# Patient Record
Sex: Female | Born: 1990 | Hispanic: Yes | Marital: Married | Smoking: Current every day smoker
Health system: Southern US, Community
[De-identification: ages and names within clinical notes are randomized; demographics above are authoritative.]

## PROBLEM LIST (undated history)

## (undated) ENCOUNTER — Inpatient Hospital Stay (HOSPITAL_COMMUNITY): Payer: Self-pay

## (undated) DIAGNOSIS — M1990 Unspecified osteoarthritis, unspecified site: Secondary | ICD-10-CM

## (undated) DIAGNOSIS — R0600 Dyspnea, unspecified: Secondary | ICD-10-CM

## (undated) DIAGNOSIS — T7840XA Allergy, unspecified, initial encounter: Secondary | ICD-10-CM

## (undated) DIAGNOSIS — E119 Type 2 diabetes mellitus without complications: Secondary | ICD-10-CM

## (undated) DIAGNOSIS — F419 Anxiety disorder, unspecified: Secondary | ICD-10-CM

## (undated) DIAGNOSIS — J45909 Unspecified asthma, uncomplicated: Secondary | ICD-10-CM

## (undated) DIAGNOSIS — R569 Unspecified convulsions: Secondary | ICD-10-CM

## (undated) DIAGNOSIS — I10 Essential (primary) hypertension: Secondary | ICD-10-CM

## (undated) HISTORY — DX: Anxiety disorder, unspecified: F41.9

## (undated) HISTORY — PX: TONSILLECTOMY: SUR1361

## (undated) HISTORY — PX: WISDOM TOOTH EXTRACTION: SHX21

## (undated) HISTORY — DX: Allergy, unspecified, initial encounter: T78.40XA

## (undated) HISTORY — DX: Unspecified osteoarthritis, unspecified site: M19.90

---
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
## 2012-04-29 ENCOUNTER — Emergency Department: Payer: Self-pay | Admitting: Emergency Medicine

## 2013-08-31 ENCOUNTER — Emergency Department: Payer: Self-pay | Admitting: Emergency Medicine

## 2013-08-31 LAB — COMPREHENSIVE METABOLIC PANEL
ALK PHOS: 70 U/L
ALT: 23 U/L (ref 12–78)
ANION GAP: 4 — AB (ref 7–16)
AST: 16 U/L (ref 15–37)
Albumin: 3.4 g/dL (ref 3.4–5.0)
BILIRUBIN TOTAL: 0.2 mg/dL (ref 0.2–1.0)
BUN: 11 mg/dL (ref 7–18)
Calcium, Total: 8.7 mg/dL (ref 8.5–10.1)
Chloride: 106 mmol/L (ref 98–107)
Co2: 28 mmol/L (ref 21–32)
Creatinine: 0.6 mg/dL (ref 0.60–1.30)
GLUCOSE: 88 mg/dL (ref 65–99)
OSMOLALITY: 274 (ref 275–301)
Potassium: 4.1 mmol/L (ref 3.5–5.1)
Sodium: 138 mmol/L (ref 136–145)
Total Protein: 8.1 g/dL (ref 6.4–8.2)

## 2013-08-31 LAB — CBC WITH DIFFERENTIAL/PLATELET
BASOS ABS: 0.1 10*3/uL (ref 0.0–0.1)
Basophil %: 0.5 %
EOS PCT: 1.6 %
Eosinophil #: 0.2 10*3/uL (ref 0.0–0.7)
HCT: 43.9 % (ref 35.0–47.0)
HGB: 14.7 g/dL (ref 12.0–16.0)
LYMPHS ABS: 4.1 10*3/uL — AB (ref 1.0–3.6)
Lymphocyte %: 28.9 %
MCH: 30 pg (ref 26.0–34.0)
MCHC: 33.5 g/dL (ref 32.0–36.0)
MCV: 89 fL (ref 80–100)
Monocyte #: 0.9 x10 3/mm (ref 0.2–0.9)
Monocyte %: 6.3 %
NEUTROS PCT: 62.7 %
Neutrophil #: 8.9 10*3/uL — ABNORMAL HIGH (ref 1.4–6.5)
PLATELETS: 202 10*3/uL (ref 150–440)
RBC: 4.91 10*6/uL (ref 3.80–5.20)
RDW: 14 % (ref 11.5–14.5)
WBC: 14.2 10*3/uL — AB (ref 3.6–11.0)

## 2013-08-31 LAB — URINALYSIS, COMPLETE
Bacteria: NONE SEEN
Bilirubin,UR: NEGATIVE
Glucose,UR: NEGATIVE mg/dL (ref 0–75)
KETONE: NEGATIVE
LEUKOCYTE ESTERASE: NEGATIVE
NITRITE: NEGATIVE
PROTEIN: NEGATIVE
Ph: 6 (ref 4.5–8.0)
RBC,UR: 7 /HPF (ref 0–5)
Specific Gravity: 1.023 (ref 1.003–1.030)
WBC UR: 2 /HPF (ref 0–5)

## 2013-08-31 LAB — PREGNANCY, URINE: Pregnancy Test, Urine: NEGATIVE m[IU]/mL

## 2013-08-31 LAB — LIPASE, BLOOD: Lipase: 101 U/L (ref 73–393)

## 2015-01-27 ENCOUNTER — Encounter: Payer: Self-pay | Admitting: *Deleted

## 2015-01-27 ENCOUNTER — Emergency Department
Admission: EM | Admit: 2015-01-27 | Discharge: 2015-01-27 | Disposition: A | Discharge status: Home / Self Care | Payer: Self-pay | Attending: Emergency Medicine | Admitting: Emergency Medicine

## 2015-01-27 DIAGNOSIS — N3001 Acute cystitis with hematuria: Secondary | ICD-10-CM

## 2015-01-27 DIAGNOSIS — Z720 Tobacco use: Secondary | ICD-10-CM | POA: Insufficient documentation

## 2015-01-27 DIAGNOSIS — Z3202 Encounter for pregnancy test, result negative: Secondary | ICD-10-CM | POA: Insufficient documentation

## 2015-01-27 DIAGNOSIS — E119 Type 2 diabetes mellitus without complications: Secondary | ICD-10-CM | POA: Insufficient documentation

## 2015-01-27 HISTORY — DX: Type 2 diabetes mellitus without complications: E11.9

## 2015-01-27 LAB — URINALYSIS COMPLETE WITH MICROSCOPIC (ARMC ONLY)
BACTERIA UA: NONE SEEN
Bilirubin Urine: NEGATIVE
Glucose, UA: NEGATIVE mg/dL
Ketones, ur: NEGATIVE mg/dL
Nitrite: NEGATIVE
PH: 7 (ref 5.0–8.0)
Protein, ur: 30 mg/dL — AB
Specific Gravity, Urine: 1.025 (ref 1.005–1.030)

## 2015-01-27 MED ORDER — SULFAMETHOXAZOLE-TRIMETHOPRIM 800-160 MG PO TABS: 1.0000 | ORAL_TABLET | Freq: Two times a day (BID) | ORAL | Status: DC

## 2015-01-27 MED ORDER — PHENAZOPYRIDINE HCL 200 MG PO TABS: 200.0000 mg | ORAL_TABLET | Freq: Three times a day (TID) | ORAL | Status: DC | PRN

## 2015-01-27 NOTE — ED Notes (Signed)
Urine Preg - Negative 

## 2015-01-27 NOTE — ED Notes (Signed)
Discussed discharge instructions, prescriptions, and follow-up care with patient. No questions or concerns at this time. Pt stable at discharge.  

## 2015-01-27 NOTE — Discharge Instructions (Signed)

## 2015-01-27 NOTE — ED Provider Notes (Signed)
[facility] Emergency Department Provider Note  ____________________________________________  Time seen: Approximately 4:12 PM  I have reviewed the triage vital signs and the nursing notes.   HISTORY  Chief Complaint Urinary Frequency    HPI Carla Nichols is a 24 y.o. female who presents for evaluation of urinary tract burning and frequency for 4 days.   Past Medical History  Diagnosis Date  . Diabetes mellitus without complication (HCC)     There are no active problems to display for this patient.   History reviewed. No pertinent past surgical history.  Current Outpatient Rx  Name  Route  Sig  Dispense  Refill  . phenazopyridine (PYRIDIUM) 200 MG tablet   Oral   Take 1 tablet (200 mg total) by mouth 3 (three) times daily as needed for pain.   6 tablet   0   . sulfamethoxazole-trimethoprim (BACTRIM DS,SEPTRA DS) 800-160 MG tablet   Oral   Take 1 tablet by mouth 2 (two) times daily.   14 tablet   0     Allergies Review of patient's allergies indicates no known allergies.  History reviewed. No pertinent family history.  Social History Social History  Substance Use Topics  . Smoking status: Current Every Day Smoker  . Smokeless tobacco: None  . Alcohol Use: None    Review of Systems Constitutional: No fever/chills Eyes: No visual changes. ENT: No sore throat. Cardiovascular: Denies chest pain. Respiratory: Denies shortness of breath. Gastrointestinal: No abdominal pain.  No nausea, no vomiting.  No diarrhea.  No constipation. Genitourinary: Positive for dysuria. Musculoskeletal: Positive for back pain. Skin: Negative for rash. Neurological: Negative for headaches, focal weakness or numbness.  10-point ROS otherwise negative.  ____________________________________________   PHYSICAL EXAM:  VITAL SIGNS: ED Triage Vitals  Enc Vitals Group     BP 01/27/15 1521 135/80 mmHg     Pulse Rate 01/27/15 1521 70     Resp  01/27/15 1521 20     Temp 01/27/15 1521 97.6 F (36.4 C)     Temp Source 01/27/15 1521 Oral     SpO2 01/27/15 1521 99 %     Weight 01/27/15 1521 220 lb (99.791 kg)     Height 01/27/15 1521  (1.6 m)     Head Cir --      Peak Flow --      Pain Score 01/27/15 1522 8     Pain Loc --      Pain Edu? --      Excl. in GC? --     Constitutional: Alert and oriented. Well appearing and in no acute distress. Cardiovascular: Normal rate, regular rhythm. Grossly normal heart sounds.  Good peripheral circulation. Respiratory: Normal respiratory effort.  No retractions. Lungs CTAB. Gastrointestinal: Soft and nontender. No distention. No abdominal bruits. Mild CVA tenderness. Musculoskeletal: No lower extremity tenderness nor edema.  No joint effusions. Neurologic:  Normal speech and language. No gross focal neurologic deficits are appreciated. No gait instability. Skin:  Skin is warm, dry and intact. No rash noted. Psychiatric: Mood and affect are normal. Speech and behavior are normal.  ____________________________________________   LABS (all labs ordered are listed, but only abnormal results are displayed)  Labs Reviewed  URINALYSIS COMPLETEWITH MICROSCOPIC ([facility] ONLY) - Abnormal; Notable for the following:    Color, Urine YELLOW (*)    APPearance CLOUDY (*)    Hgb urine dipstick 1+ (*)    Protein, ur 30 (*)    Leukocytes, UA 3+ (*)  Squamous Epithelial / LPF 0-5 (*)    All other components within normal limits  POC URINE PREG, ED      PROCEDURES  Procedure(s) performed: None  Critical Care performed: No  ____________________________________________   INITIAL IMPRESSION / ASSESSMENT AND PLAN / ED COURSE  Pertinent labs & imaging results that were available during my care of the patient were reviewed by me and considered in my medical decision making (see chart for details).  Acute urinary tract infection. Rx given for Bactrim DS twice a day #14 and peridium 200 mg 3  times a day #6 patient follow-up with PCP or return to the ER with any worsening symptomology. She voices no other emergency medical complaints at this time. ____________________________________________   FINAL CLINICAL IMPRESSION(S) / ED DIAGNOSES  Final diagnoses:  Acute cystitis with hematuria      Evangeline Dakin, PA-C 01/27/15 1643  Emily Filbert, MD 01/27/15 [phone]

## 2015-01-27 NOTE — ED Notes (Signed)
Pt states urinary burning and frequency for 4 days

## 2015-09-19 ENCOUNTER — Encounter: Payer: Self-pay | Admitting: Urgent Care

## 2015-09-19 ENCOUNTER — Emergency Department
Admission: EM | Admit: 2015-09-19 | Discharge: 2015-09-20 | Disposition: A | Discharge status: Home / Self Care | Payer: Self-pay | Attending: Emergency Medicine | Admitting: Emergency Medicine

## 2015-09-19 DIAGNOSIS — R102 Pelvic and perineal pain: Secondary | ICD-10-CM | POA: Insufficient documentation

## 2015-09-19 DIAGNOSIS — R1011 Right upper quadrant pain: Secondary | ICD-10-CM

## 2015-09-19 DIAGNOSIS — J45909 Unspecified asthma, uncomplicated: Secondary | ICD-10-CM | POA: Insufficient documentation

## 2015-09-19 DIAGNOSIS — I10 Essential (primary) hypertension: Secondary | ICD-10-CM | POA: Insufficient documentation

## 2015-09-19 DIAGNOSIS — E119 Type 2 diabetes mellitus without complications: Secondary | ICD-10-CM | POA: Insufficient documentation

## 2015-09-19 DIAGNOSIS — F17200 Nicotine dependence, unspecified, uncomplicated: Secondary | ICD-10-CM | POA: Insufficient documentation

## 2015-09-19 HISTORY — DX: Essential (primary) hypertension: I10

## 2015-09-19 LAB — POCT PREGNANCY, URINE: PREG TEST UR: NEGATIVE

## 2015-09-19 NOTE — ED Notes (Signed)
Patient presents with c/o suprapubic pain x 4-5 days. (+) dysuria reported. Additionally, patient reports that she is having "electrical impulses" running through her head and her vision went black tonight while sitting on the cough. She reports that is spotting as well; not heavy.

## 2015-09-19 NOTE — ED Notes (Signed)
Date of LMP  Was May 2nd, 2017. Pt reports unprotected sex with possibility of being pregnant, pt reports (-) pregnancy tests at home x2.

## 2015-09-19 NOTE — ED Provider Notes (Signed)
[facility] Emergency Department Provider Note  ____________________________________________  Time seen: 11:40 PM  I have reviewed the triage vital signs and the nursing notes.   HISTORY  Chief Complaint Abdominal Pain and Headache      HPI Carla Nichols is a 25 y.o. female presents with 4 day history of suprapubic discomfort accompanied by nausea and vaginal spotting. Patient states last this appearance May 2 area patient admits to unprotected sex with possibility of being pregnant.     Past Medical History  Diagnosis Date  . Diabetes mellitus without complication (HCC)   . Hypertension     There are no active problems to display for this patient.   Past Surgical History  Procedure Laterality Date  . Tonsillectomy      Current Outpatient Rx  Name  Route  Sig  Dispense  Refill  . phenazopyridine (PYRIDIUM) 200 MG tablet   Oral   Take 1 tablet (200 mg total) by mouth 3 (three) times daily as needed for pain.   6 tablet   0   . sulfamethoxazole-trimethoprim (BACTRIM DS,SEPTRA DS) 800-160 MG tablet   Oral   Take 1 tablet by mouth 2 (two) times daily.   14 tablet   0     Allergies No known drug allergies No family history on file.  Social History Social History  Substance Use Topics  . Smoking status: Current Every Day Smoker  . Smokeless tobacco: None  . Alcohol Use: Yes    Review of Systems  Constitutional: Negative for fever. Eyes: Negative for visual changes. ENT: Negative for sore throat. Cardiovascular: Negative for chest pain. Respiratory: Negative for shortness of breath. Gastrointestinal: Negative for abdominal pain, vomiting and diarrhea. Genitourinary: Negative for dysuria. Musculoskeletal: Negative for back pain. Skin: Negative for rash. Neurological: Negative for headaches, focal weakness or numbness.   10-point ROS otherwise negative.  ____________________________________________   PHYSICAL  EXAM:  VITAL SIGNS: ED Triage Vitals  Enc Vitals Group     BP 09/19/15 2252 141/97 mmHg     Pulse Rate 09/19/15 2252 98     Resp 09/19/15 2252 18     Temp 09/19/15 2252 98.8 F (37.1 C)     Temp Source 09/19/15 2252 Oral     SpO2 09/19/15 2252 98 %     Weight 09/19/15 2252 254 lb (115.214 kg)     Height 09/19/15 2252 5\' 3"  (1.6 m)     Head Cir --      Peak Flow --      Pain Score 09/19/15 2252 8     Pain Loc --      Pain Edu? --      Excl. in GC? --      Constitutional: Alert and oriented. Well appearing and in no distress. Eyes: Conjunctivae are normal. PERRL. Normal extraocular movements. ENT   Head: Normocephalic and atraumatic.   Nose: No congestion/rhinnorhea.   Mouth/Throat: Mucous membranes are moist.   Neck: No stridor. Hematological/Lymphatic/Immunilogical: No cervical lymphadenopathy. Cardiovascular: Normal rate, regular rhythm. Normal and symmetric distal pulses are present in all extremities. No murmurs, rubs, or gallops. Respiratory: Normal respiratory effort without tachypnea nor retractions. Breath sounds are clear and equal bilaterally. No wheezes/rales/rhonchi. Gastrointestinal: Right upper quadrant tenderness to palpation. No distention. There is no CVA tenderness. Genitourinary: Scant vaginal bleeding no discharge noted on pelvic exam. No cervical motion tenderness  Musculoskeletal: Nontender with normal range of motion in all extremities. No joint effusions.  No lower extremity tenderness  nor edema. Neurologic:  Normal speech and language. No gross focal neurologic deficits are appreciated. Speech is normal.  Skin:  Skin is warm, dry and intact. No rash noted. Psychiatric: Mood and affect are normal. Speech and behavior are normal. Patient exhibits appropriate insight and judgment.    RADIOLOGY  US Abdomen Limited RUQ (Final result) Result time: 09/20/15 00:36:18   Final result by Rad Results In Interface (09/20/15 00:36:18)   Narrative:    CLINICAL DATA: Right upper quadrant pain for 1 week.  EXAM: US ABDOMEN LIMITED - RIGHT UPPER QUADRANT  COMPARISON: 08/31/2013  FINDINGS: Gallbladder:  Physiologically distended. No gallstones or wall thickening visualized. Wall thickness of 2 mm. No sonographic Murphy sign noted by sonographer.  Common bile duct:  Diameter: 2.7 mm, normal.  Liver:  No focal lesion identified. Within normal limits in parenchymal echogenicity. Normal directional flow in the main portal vein.  IMPRESSION: Normal right upper quadrant ultrasound.   Electronically Signed By: Rubye Oaks M.D. On: 09/20/2015 00:36        INITIAL IMPRESSION / ASSESSMENT AND PLAN / ED COURSE  Pertinent labs & imaging results that were available during my care of the patient were reviewed by me and considered in my medical decision making (see chart for details).  Patient requested a female physician to perform her pelvic exam. As such Dr. Zenda Alpers obliged in doing so.  ____________________________________________   FINAL CLINICAL IMPRESSION(S) / ED DIAGNOSES  Final diagnoses:  Right upper quadrant pain  Pelvic pain in female      Darci Current, MD 09/20/15 [phone]

## 2015-09-20 ENCOUNTER — Emergency Department: Payer: Self-pay

## 2015-09-20 ENCOUNTER — Emergency Department
Admission: EM | Admit: 2015-09-20 | Discharge: 2015-09-20 | Disposition: A | Discharge status: Home / Self Care | Payer: Self-pay | Attending: Emergency Medicine | Admitting: Emergency Medicine

## 2015-09-20 ENCOUNTER — Telehealth: Payer: Self-pay | Admitting: Emergency Medicine

## 2015-09-20 ENCOUNTER — Encounter: Payer: Self-pay | Admitting: Emergency Medicine

## 2015-09-20 DIAGNOSIS — I10 Essential (primary) hypertension: Secondary | ICD-10-CM | POA: Insufficient documentation

## 2015-09-20 DIAGNOSIS — R300 Dysuria: Secondary | ICD-10-CM | POA: Insufficient documentation

## 2015-09-20 DIAGNOSIS — N898 Other specified noninflammatory disorders of vagina: Secondary | ICD-10-CM | POA: Insufficient documentation

## 2015-09-20 DIAGNOSIS — F17200 Nicotine dependence, unspecified, uncomplicated: Secondary | ICD-10-CM | POA: Insufficient documentation

## 2015-09-20 DIAGNOSIS — K2970 Gastritis, unspecified, without bleeding: Secondary | ICD-10-CM | POA: Insufficient documentation

## 2015-09-20 DIAGNOSIS — E119 Type 2 diabetes mellitus without complications: Secondary | ICD-10-CM | POA: Insufficient documentation

## 2015-09-20 DIAGNOSIS — J45909 Unspecified asthma, uncomplicated: Secondary | ICD-10-CM | POA: Insufficient documentation

## 2015-09-20 HISTORY — DX: Unspecified asthma, uncomplicated: J45.909

## 2015-09-20 LAB — CBC WITH DIFFERENTIAL/PLATELET
BASOS ABS: 0.2 10*3/uL — AB (ref 0–0.1)
EOS ABS: 0.5 10*3/uL (ref 0–0.7)
Eosinophils Relative: 5 %
HCT: 40.7 % (ref 35.0–47.0)
HEMOGLOBIN: 14.1 g/dL (ref 12.0–16.0)
Lymphocytes Relative: 22 %
Lymphs Abs: 2.1 10*3/uL (ref 1.0–3.6)
MCH: 31 pg (ref 26.0–34.0)
MCHC: 34.7 g/dL (ref 32.0–36.0)
MCV: 89.5 fL (ref 80.0–100.0)
Monocytes Absolute: 0.7 10*3/uL (ref 0.2–0.9)
Neutro Abs: 6.3 10*3/uL (ref 1.4–6.5)
PLATELETS: 172 10*3/uL (ref 150–440)
RBC: 4.55 MIL/uL (ref 3.80–5.20)
RDW: 13.8 % (ref 11.5–14.5)
WBC: 9.8 10*3/uL (ref 3.6–11.0)

## 2015-09-20 LAB — URINALYSIS COMPLETE WITH MICROSCOPIC (ARMC ONLY)
Bacteria, UA: NONE SEEN
Bilirubin Urine: NEGATIVE
Glucose, UA: NEGATIVE mg/dL
KETONES UR: NEGATIVE mg/dL
Nitrite: NEGATIVE
PH: 5 (ref 5.0–8.0)
PROTEIN: NEGATIVE mg/dL
Specific Gravity, Urine: 1.023 (ref 1.005–1.030)

## 2015-09-20 LAB — CHLAMYDIA/NGC RT PCR (ARMC ONLY)
CHLAMYDIA TR: DETECTED — AB
N GONORRHOEAE: NOT DETECTED

## 2015-09-20 LAB — COMPREHENSIVE METABOLIC PANEL
ALBUMIN: 4 g/dL (ref 3.5–5.0)
ALK PHOS: 50 U/L (ref 38–126)
ALT: 17 U/L (ref 14–54)
ANION GAP: 6 (ref 5–15)
AST: 20 U/L (ref 15–41)
BILIRUBIN TOTAL: 0.4 mg/dL (ref 0.3–1.2)
BUN: 13 mg/dL (ref 6–20)
CALCIUM: 8.8 mg/dL — AB (ref 8.9–10.3)
CO2: 27 mmol/L (ref 22–32)
Chloride: 105 mmol/L (ref 101–111)
Creatinine, Ser: 0.68 mg/dL (ref 0.44–1.00)
GLUCOSE: 87 mg/dL (ref 65–99)
Potassium: 4 mmol/L (ref 3.5–5.1)
Sodium: 138 mmol/L (ref 135–145)
TOTAL PROTEIN: 7.3 g/dL (ref 6.5–8.1)

## 2015-09-20 LAB — WET PREP, GENITAL
Sperm: NONE SEEN
Trich, Wet Prep: NONE SEEN
YEAST WET PREP: NONE SEEN

## 2015-09-20 LAB — LIPASE, BLOOD: LIPASE: 23 U/L (ref 11–51)

## 2015-09-20 MED ORDER — DIATRIZOATE MEGLUMINE & SODIUM 66-10 % PO SOLN
15.0000 mL | Freq: Once | ORAL | Status: AC
  Administered 2015-09-20: 15 mL via ORAL

## 2015-09-20 MED ORDER — FAMOTIDINE 40 MG PO TABS: 40.0000 mg | ORAL_TABLET | Freq: Every evening | ORAL | Status: DC

## 2015-09-20 MED ORDER — IOPAMIDOL (ISOVUE-300) INJECTION 61%
100.0000 mL | Freq: Once | INTRAVENOUS | Status: AC | PRN
  Administered 2015-09-20: 100 mL via INTRAVENOUS
  Filled 2015-09-20: qty 100

## 2015-09-20 NOTE — Discharge Instructions (Signed)

## 2015-09-20 NOTE — ED Provider Notes (Signed)
[facility] Emergency Department Provider Note  ____________________________________________  Time seen: Approximately 4:37 PM  I have reviewed the triage vital signs and the nursing notes.   HISTORY  Chief Complaint Abdominal Pain    HPI Carla Nichols is a 25 y.o. female who presents to the emergency department for evaluation of abdominal pain. She was evaluated here yesterday for the same. Chlamydia test was positive and azithromycin was called in to the pharmacy. Patient states she took the medication and fell asleep. She states she woke up about an hour and a half later and vomited. She no longer feels nauseated, but the abdominal pain is worse. Pain is epigastric and "feels like something might explode." She does not feel that chlamydia is the cause of this pain.   Past Medical History  Diagnosis Date  . Diabetes mellitus without complication (HCC)   . Hypertension   . Asthma     There are no active problems to display for this patient.   Past Surgical History  Procedure Laterality Date  . Tonsillectomy      Current Outpatient Rx  Name  Route  Sig  Dispense  Refill  . famotidine (PEPCID) 40 MG tablet   Oral   Take 1 tablet (40 mg total) by mouth every evening.   30 tablet   0     Allergies Review of patient's allergies indicates no known allergies.  No family history on file.  Social History Social History  Substance Use Topics  . Smoking status: Current Every Day Smoker  . Smokeless tobacco: None  . Alcohol Use: Yes    Review of Systems Constitutional: Negative for fever. Respiratory: Negative for shortness of breath or cough. Gastrointestinal: Positive for abdominal pain; positive for vomiting 1 time today. Genitourinary: Positive for dysuria , positive for vaginal discharge. Musculoskeletal: Negative for back pain. Skin: Negative. ____________________________________________   PHYSICAL EXAM:  VITAL SIGNS: ED  Triage Vitals  Enc Vitals Group     BP 09/20/15 1550 132/92 mmHg     Pulse Rate 09/20/15 1550 71     Resp 09/20/15 1550 16     Temp 09/20/15 1550 98 F (36.7 C)     Temp Source 09/20/15 1550 Oral     SpO2 09/20/15 1550 100 %     Weight 09/20/15 1550 300 lb (136.079 kg)     Height 09/20/15 1550  (1.6 m)     Head Cir --      Peak Flow --      Pain Score 09/20/15 1551 10     Pain Loc --      Pain Edu? --      Excl. in GC? --     Constitutional: Alert and oriented. Well appearing and in no acute distress. Resting  easily on stretcher looking at cellphone upon entering room. Eyes: Conjunctivae are normal. EOMI. Mouth/Throat: Mucous membranes are moist. Respiratory: Normal respiratory effort.  No retractions. Gastrointestinal: Epigastric tenderness on palpation; bowel sounds normal x 4 quads. Genitourinary: Pelvic exam: deferred. Skin:  Skin is warm, dry and intact. No rash noted. ____________________________________________   LABS (all labs ordered are listed, but only abnormal results are displayed)  Labs Reviewed - No data to display ____________________________________________  RADIOLOGY  Mild gastritis cannot be excluded in the CT abdomen and pelvis with contrast. ____________________________________________   PROCEDURES  Procedure(s) performed: None  ____________________________________________   INITIAL IMPRESSION / ASSESSMENT AND PLAN / ED COURSE  Pertinent labs & imaging results that  were available during my care of the patient were reviewed by me and considered in my medical decision making (see chart for details).  Patient relaxing on stretcher. Family at bedside. Will do CT of abdomen to fully evaluate pain. No labs today--results from 09/19/15 reviewed.  Patient will be given prescriptions for Pepcid today. [facility] or [facility] for symptoms that are not improving over the week. She was advised to  return to the ER for symptoms that change or worsen if unable to schedule an appointment. ____________________________________________   FINAL CLINICAL IMPRESSION(S) / ED DIAGNOSES  Final diagnoses:  Gastritis    Note:  This document was prepared using Dragon voice recognition software and may include unintentional dictation errors.   Chinita PesterCari B Luismario Coston, FNP 09/20/15 2044  Sharman CheekPhillip Stafford, MD 09/21/15 2328

## 2015-09-20 NOTE — ED Notes (Signed)
Pt seen here last night for same; was told she had chlamydia. Pt reports vomiting after taking medications, abdominal pain is worse.

## 2015-09-20 NOTE — ED Notes (Signed)
Patient transported to CT 

## 2015-09-20 NOTE — ED Notes (Signed)
Called pt and informed her of positive chlamydia test and need for treat ment. Also discussed partner tx.   Called in azithromycin 1 gram po x 1 to [facility] [address] per dr Mayford Knifewilliams.

## 2015-09-20 NOTE — Discharge Instructions (Signed)

## 2015-09-28 ENCOUNTER — Emergency Department: Payer: Self-pay

## 2015-09-28 ENCOUNTER — Encounter: Payer: Self-pay | Admitting: Emergency Medicine

## 2015-09-28 ENCOUNTER — Emergency Department
Admission: EM | Admit: 2015-09-28 | Discharge: 2015-09-28 | Disposition: A | Discharge status: Home / Self Care | Payer: Self-pay | Attending: Emergency Medicine | Admitting: Emergency Medicine

## 2015-09-28 DIAGNOSIS — J45909 Unspecified asthma, uncomplicated: Secondary | ICD-10-CM | POA: Insufficient documentation

## 2015-09-28 DIAGNOSIS — Y939 Activity, unspecified: Secondary | ICD-10-CM | POA: Insufficient documentation

## 2015-09-28 DIAGNOSIS — X501XXA Overexertion from prolonged static or awkward postures, initial encounter: Secondary | ICD-10-CM | POA: Insufficient documentation

## 2015-09-28 DIAGNOSIS — Y929 Unspecified place or not applicable: Secondary | ICD-10-CM | POA: Insufficient documentation

## 2015-09-28 DIAGNOSIS — E119 Type 2 diabetes mellitus without complications: Secondary | ICD-10-CM | POA: Insufficient documentation

## 2015-09-28 DIAGNOSIS — S9032XA Contusion of left foot, initial encounter: Secondary | ICD-10-CM

## 2015-09-28 DIAGNOSIS — S93402A Sprain of unspecified ligament of left ankle, initial encounter: Secondary | ICD-10-CM | POA: Insufficient documentation

## 2015-09-28 DIAGNOSIS — I10 Essential (primary) hypertension: Secondary | ICD-10-CM | POA: Insufficient documentation

## 2015-09-28 DIAGNOSIS — Y999 Unspecified external cause status: Secondary | ICD-10-CM | POA: Insufficient documentation

## 2015-09-28 DIAGNOSIS — F17200 Nicotine dependence, unspecified, uncomplicated: Secondary | ICD-10-CM | POA: Insufficient documentation

## 2015-09-28 MED ORDER — NAPROXEN 500 MG PO TABS: 500.0000 mg | ORAL_TABLET | Freq: Two times a day (BID) | ORAL | Status: DC

## 2015-09-28 NOTE — ED Notes (Addendum)
Pt presents to ED with reports of left ankle pain. Pt states was wearing heels this past Saturday and rolled her ankle. Pt reports pain has increased and is having difficulty bearing weight. Pt ambulated in triage without difficulty.

## 2015-09-28 NOTE — Discharge Instructions (Signed)
Ankle Sprain An ankle sprain is an injury to the strong, fibrous tissues (ligaments) that hold your ankle bones together.  HOME CARE   Put ice on your ankle for 1-2 days or as told by your doctor.  Put ice in a plastic bag.  Place a towel between your skin and the bag.  Leave the ice on for 15-20 minutes at a time, every 2 hours while you are awake.  Only take medicine as told by your doctor.  Raise (elevate) your injured ankle above the level of your heart as much as possible for 2-3 days.  Use crutches if your doctor tells you to. Slowly put your own weight on the affected ankle. Use the crutches until you can walk without pain.  If you have a plaster splint:  Do not rest it on anything harder than a pillow for 24 hours.  Do not put weight on it.  Do not get it wet.  Take it off to shower or bathe.  If given, use an elastic wrap or support stocking for support. Take the wrap off if your toes lose feeling (numb), tingle, or turn cold or blue.  If you have an air splint:  Add or let out air to make it comfortable.  Take it off at night and to shower and bathe.  Wiggle your toes and move your ankle up and down often while you are wearing it. GET HELP IF:  You have rapidly increasing bruising or puffiness (swelling).  Your toes feel very cold.  You lose feeling in your foot.  Your medicine does not help your pain. GET HELP RIGHT AWAY IF:   Your toes lose feeling (numb) or turn blue.  You have severe pain that is increasing. MAKE SURE YOU:   Understand these instructions.  Will watch your condition.  Will get help right away if you are not doing well or get worse.   This information is not intended to replace advice given to you by your health care provider. Make sure you discuss any questions you have with your health care provider.   Document Released: 09/24/2007 Document Revised: 04/28/2014 Document Reviewed: 10/20/2011 Elsevier Interactive Patient  Education 2016 [employer]  Foot Contusion  A foot contusion is a deep bruise to the foot. Contusions happen when an injury causes bleeding under the skin. Signs of bruising include pain, puffiness (swelling), and discolored skin. The contusion may turn blue, purple, or yellow. HOME CARE  Put ice on the injured area.  Put ice in a plastic bag.  Place a towel between your skin and the bag.  Leave the ice on for 15-20 minutes, 03-04 times a day.  Only take medicines as told by your doctor.  Use an elastic wrap only as told. You may remove the wrap for sleeping, showering, and bathing. Take the wrap off if you lose feeling (numb) in your toes, or they turn blue or cold. Put the wrap on more loosely.  Keep the foot raised (elevated) with pillows.  If your foot hurts, avoid standing or walking.  When your doctor says it is okay to use your foot, start using it slowly. If you have pain, lessen how much you use your foot.  See your doctor as told. GET HELP RIGHT AWAY IF:   You have more redness, puffiness, or pain in your foot.  Your puffiness or pain does not get better with medicine.  You lose feeling in your foot, or you cannot move your toes.  Your foot turns cold or blue.  You have pain when you move your toes.  Your foot feels warm.  Your contusion does not get better in 2 days. MAKE SURE YOU:   Understand these instructions.  Will watch this condition.  Will get help right away if you or your child is not doing well or gets worse.   This information is not intended to replace advice given to you by your health care provider. Make sure you discuss any questions you have with your health care provider.   Document Released: 01/15/2008 Document Revised: 10/07/2011 Document Reviewed: 12/12/2014 Elsevier Interactive Patient Education 2016 [employer]  Adult nurselastic Bandage and RICE WHAT DOES AN ELASTIC BANDAGE DO? Elastic bandages come in different shapes and sizes.  They generally provide support to your injury and reduce swelling while you are healing, but they can perform different functions. Your health care provider will help you to decide what is best for your protection, recovery, or rehabilitation following an injury. WHAT ARE SOME GENERAL TIPS FOR USING AN ELASTIC BANDAGE?  Use the bandage as directed by the maker of the bandage that you are using.  Do not wrap the bandage too tightly. This may cut off the circulation in the arm or leg in the area below the bandage.  If part of your body beyond the bandage becomes blue, numb, cold, swollen, or is more painful, your bandage is most likely too tight. If this occurs, remove your bandage and reapply it more loosely.  See your health care provider if the bandage seems to be making your problems worse rather than better.  An elastic bandage should be removed and reapplied every 3-4 hours or as directed by your health care provider. WHAT IS RICE? The routine care of many injuries includes rest, ice, compression, and elevation (RICE therapy).  Rest Rest is required to allow your body to heal. Generally, you can resume your routine activities when you are comfortable and have been given permission by your health care provider. Ice Icing your injury helps to keep the swelling down and it reduces pain. Do not apply ice directly to your skin.  Put ice in a plastic bag.  Place a towel between your skin and the bag.  Leave the ice on for 20 minutes, 2-3 times per day. Do this for as long as you are directed by your health care provider. Compression Compression helps to keep swelling down, gives support, and helps with discomfort. Compression may be done with an elastic bandage. Elevation Elevation helps to reduce swelling and it decreases pain. If possible, your injured area should be placed at or above the level of your heart or the center of your chest. WHEN SHOULD I SEEK MEDICAL CARE? You should seek  medical care if:  You have persistent pain and swelling.  Your symptoms are getting worse rather than improving. These symptoms may indicate that further evaluation or further X-rays are needed. Sometimes, X-rays may not show a small broken bone (fracture) until a number of days later. Make a follow-up appointment with your health care provider. Ask when your X-ray results will be ready. Make sure that you get your X-ray results. WHEN SHOULD I SEEK IMMEDIATE MEDICAL CARE? You should seek immediate medical care if:  You have a sudden onset of severe pain at or below the area of your injury.  You develop redness or increased swelling around your injury.  You have tingling or numbness at or below the area of your  injury that does not improve after you remove the elastic bandage.   This information is not intended to replace advice given to you by your health care provider. Make sure you discuss any questions you have with your health care provider.   Document Released: 09/27/2001 Document Revised: 12/27/2014 Document Reviewed: 11/21/2013 Elsevier Interactive Patient Education [employer]

## 2015-09-28 NOTE — ED Provider Notes (Signed)
[facility] Emergency Department Provider Note  ____________________________________________  Time seen: Approximately 5:29 PM  I have reviewed the triage vital signs and the nursing notes.   HISTORY  Chief Complaint Ankle Pain    HPI Carla Nichols is a 25 y.o. female , NAD, presents to the emergency room with one-week history of left foot and ankle pain. States she was wearing high heels last Saturday and twisted her left ankle and foot. Attempted to wrap the ankle and foot but states it did not help. Noted swelling and pain about the left ankle and foot for the first couple of days and is now resolved. Has minimal pain about the medial left ankle but has been able to bear weight. States the pain seems to be increasing with more weightbearing especially while she is at work. Denies any numbness, weakness, tingling. Has not noted any open wounds, lacerations or skin sores.   Past Medical History  Diagnosis Date  . Diabetes mellitus without complication (HCC)   . Hypertension   . Asthma     There are no active problems to display for this patient.   Past Surgical History  Procedure Laterality Date  . Tonsillectomy      Current Outpatient Rx  Name  Route  Sig  Dispense  Refill  . famotidine (PEPCID) 40 MG tablet   Oral   Take 1 tablet (40 mg total) by mouth every evening.   30 tablet   0   . naproxen (NAPROSYN) 500 MG tablet   Oral   Take 1 tablet (500 mg total) by mouth 2 (two) times daily with a meal.   14 tablet   0     Allergies Review of patient's allergies indicates no known allergies.  No family history on file.  Social History Social History  Substance Use Topics  . Smoking status: Current Every Day Smoker  . Smokeless tobacco: None  . Alcohol Use: Yes     Review of Systems  Constitutional: No fatigue Eyes: No visual changes.  Cardiovascular: No chest pain. Respiratory: No shortness of breath.  Musculoskeletal:  Positive left foot and ankle pain.  Skin: Negative for rash, redness, swelling, skin source, lacerations. Neurological: Negative for headaches, focal weakness or numbness. No tingling 10-point ROS otherwise negative.  ____________________________________________   PHYSICAL EXAM:  VITAL SIGNS: ED Triage Vitals  Enc Vitals Group     BP 09/28/15 1707 123/65 mmHg     Pulse Rate 09/28/15 1707 92     Resp 09/28/15 1707 18     Temp 09/28/15 1707 98.4 F (36.9 C)     Temp Source 09/28/15 1707 Oral     SpO2 09/28/15 1707 99 %     Weight 09/28/15 1707 300 lb (136.079 kg)     Height 09/28/15 1707 5\' 3"  (1.6 m)     Head Cir --      Peak Flow --      Pain Score 09/28/15 1707 6     Pain Loc --      Pain Edu? --      Excl. in GC? --      Constitutional: Alert and oriented. Well appearing and in no acute distress. Eyes: Conjunctivae are normal.  Head: Atraumatic. Cardiovascular:  Good peripheral circulation with 2+ pulses noted in the left lower extremity. Capillary refill is brisk in all digits of the left foot. Respiratory: Normal respiratory effort without tachypnea or retractions.  Musculoskeletal: Full range of motion of the left  ankle, foot, toes without pain. No laxity with anterior posterior drawer the left ankle. No laxity with varus Stress of the left ankle. Patient is able to ambulate without significant limp. Patient is flat-footed. No lower extremity tenderness nor edema.  No joint effusions. Neurologic:  Normal speech and language. No gross focal neurologic deficits are appreciated. Sensation to light touch grossly intact about the left lower extremity. Skin:  Skin is warm, dry and intact. No rash, bruising, redness, swelling, skin sores, open wounds noted. Psychiatric: Mood and affect are normal. Speech and behavior are normal. Patient exhibits appropriate insight and judgement.   ____________________________________________    LABS  None ____________________________________________  EKG  None ____________________________________________  RADIOLOGY I have personally viewed and evaluated these images (plain radiographs) as part of my medical decision making, as well as reviewing the written report by the radiologist.  Dg Ankle Complete Left  09/28/2015  CLINICAL DATA:  Twisted ankle 5 days ago with medial pain EXAM: LEFT ANKLE COMPLETE - 3+ VIEW COMPARISON:  None. FINDINGS: There is no evidence of fracture, dislocation, or joint effusion. There is no evidence of arthropathy or other focal bone abnormality. Soft tissues are unremarkable. IMPRESSION: Negative. Electronically Signed   By: Esperanza Heir M.D.   On: 09/28/2015 18:05    ____________________________________________    PROCEDURES  Procedure(s) performed: None   Medications - No data to display   ____________________________________________   INITIAL IMPRESSION / ASSESSMENT AND PLAN / ED COURSE  Pertinent imaging results that were available during my care of the patient were reviewed by me and considered in my medical decision making (see chart for details).  Patient's diagnosis is consistent with left ankle sprain with left foot contusion. Patient was placed in an Ace wrap for comfort care. Unfortunately patient is right at the weight limit recommended to use the crutches we have in stock. Felt it was best for the patient obtain a set of crutches from a local medical supply store tomorrow. Patient will be discharged home with prescriptions for naproxen to use as directed. Patient advised to apply ice to the affected area 20 minutes 3-4 times daily and elevate when not ambulate. Patient is to follow up with [facility] community clinic or Dr. Rosita Kea in orthopedics if symptoms persist past this treatment course. Patient is given ED precautions to return to the ED for any worsening or new symptoms.       ____________________________________________  FINAL CLINICAL IMPRESSION(S) / ED DIAGNOSES  Final diagnoses:  Left ankle sprain, initial encounter  Foot contusion, left, initial encounter      NEW MEDICATIONS STARTED DURING THIS VISIT:  Discharge Medication List as of 09/28/2015  6:19 PM    START taking these medications   Details  naproxen (NAPROSYN) 500 MG tablet Take 1 tablet (500 mg total) by mouth 2 (two) times daily with a meal., Starting 09/28/2015, Until Discontinued, Print             Hope Pigeon, PA-C 09/28/15 1908  Jene Every, MD 09/28/15 2056

## 2015-09-28 NOTE — ED Notes (Signed)
Pt verbalized understanding of discharge instructions. NAD at this time. 

## 2015-10-10 ENCOUNTER — Encounter: Payer: Self-pay | Admitting: Obstetrics and Gynecology

## 2015-12-22 ENCOUNTER — Emergency Department
Admission: EM | Admit: 2015-12-22 | Discharge: 2015-12-22 | Disposition: A | Discharge status: Home / Self Care | Payer: Medicaid Other | Attending: Emergency Medicine | Admitting: Emergency Medicine

## 2015-12-22 ENCOUNTER — Emergency Department: Payer: Medicaid Other

## 2015-12-22 ENCOUNTER — Encounter: Payer: Self-pay | Admitting: Emergency Medicine

## 2015-12-22 DIAGNOSIS — O99331 Smoking (tobacco) complicating pregnancy, first trimester: Secondary | ICD-10-CM | POA: Diagnosis not present

## 2015-12-22 DIAGNOSIS — F17200 Nicotine dependence, unspecified, uncomplicated: Secondary | ICD-10-CM | POA: Diagnosis not present

## 2015-12-22 DIAGNOSIS — I10 Essential (primary) hypertension: Secondary | ICD-10-CM | POA: Diagnosis not present

## 2015-12-22 DIAGNOSIS — N760 Acute vaginitis: Secondary | ICD-10-CM

## 2015-12-22 DIAGNOSIS — E119 Type 2 diabetes mellitus without complications: Secondary | ICD-10-CM | POA: Diagnosis not present

## 2015-12-22 DIAGNOSIS — Z3491 Encounter for supervision of normal pregnancy, unspecified, first trimester: Secondary | ICD-10-CM

## 2015-12-22 DIAGNOSIS — Z3A01 Less than 8 weeks gestation of pregnancy: Secondary | ICD-10-CM | POA: Insufficient documentation

## 2015-12-22 DIAGNOSIS — J45909 Unspecified asthma, uncomplicated: Secondary | ICD-10-CM | POA: Diagnosis not present

## 2015-12-22 DIAGNOSIS — Z791 Long term (current) use of non-steroidal anti-inflammatories (NSAID): Secondary | ICD-10-CM | POA: Diagnosis not present

## 2015-12-22 DIAGNOSIS — O200 Threatened abortion: Secondary | ICD-10-CM

## 2015-12-22 DIAGNOSIS — B9689 Other specified bacterial agents as the cause of diseases classified elsewhere: Secondary | ICD-10-CM

## 2015-12-22 DIAGNOSIS — N390 Urinary tract infection, site not specified: Secondary | ICD-10-CM

## 2015-12-22 DIAGNOSIS — O209 Hemorrhage in early pregnancy, unspecified: Secondary | ICD-10-CM | POA: Diagnosis present

## 2015-12-22 LAB — CHLAMYDIA/NGC RT PCR (ARMC ONLY)
CHLAMYDIA TR: NOT DETECTED
N gonorrhoeae: NOT DETECTED

## 2015-12-22 LAB — CBC
HCT: 40.9 % (ref 35.0–47.0)
Hemoglobin: 14 g/dL (ref 12.0–16.0)
MCH: 31 pg (ref 26.0–34.0)
MCHC: 34.2 g/dL (ref 32.0–36.0)
MCV: 90.6 fL (ref 80.0–100.0)
PLATELETS: 160 10*3/uL (ref 150–440)
RBC: 4.52 MIL/uL (ref 3.80–5.20)
RDW: 13.8 % (ref 11.5–14.5)
WBC: 8.3 10*3/uL (ref 3.6–11.0)

## 2015-12-22 LAB — WET PREP, GENITAL
SPERM: NONE SEEN
TRICH WET PREP: NONE SEEN
YEAST WET PREP: NONE SEEN

## 2015-12-22 LAB — COMPREHENSIVE METABOLIC PANEL
ALK PHOS: 42 U/L (ref 38–126)
ALT: 15 U/L (ref 14–54)
AST: 18 U/L (ref 15–41)
Albumin: 3.6 g/dL (ref 3.5–5.0)
Anion gap: 4 — ABNORMAL LOW (ref 5–15)
BUN: 9 mg/dL (ref 6–20)
CALCIUM: 9 mg/dL (ref 8.9–10.3)
CHLORIDE: 109 mmol/L (ref 101–111)
CO2: 25 mmol/L (ref 22–32)
CREATININE: 0.52 mg/dL (ref 0.44–1.00)
GFR calc non Af Amer: >60 mL/min (ref >60)
GLUCOSE: 75 mg/dL (ref 65–99)
Potassium: 3.8 mmol/L (ref 3.5–5.1)
SODIUM: 138 mmol/L (ref 135–145)
Total Bilirubin: 0.6 mg/dL (ref 0.3–1.2)
Total Protein: 7.2 g/dL (ref 6.5–8.1)

## 2015-12-22 LAB — URINALYSIS COMPLETE WITH MICROSCOPIC (ARMC ONLY)
BACTERIA UA: NONE SEEN
Bilirubin Urine: NEGATIVE
GLUCOSE, UA: NEGATIVE mg/dL
Ketones, ur: NEGATIVE mg/dL
NITRITE: NEGATIVE
PROTEIN: NEGATIVE mg/dL
SPECIFIC GRAVITY, URINE: 1.019 (ref 1.005–1.030)
pH: 6 (ref 5.0–8.0)

## 2015-12-22 LAB — HCG, QUANTITATIVE, PREGNANCY: hCG, Beta Chain, Quant, S: 1528 m[IU]/mL — ABNORMAL HIGH (ref <5)

## 2015-12-22 LAB — ABO/RH: ABO/RH(D): A POS

## 2015-12-22 MED ORDER — CEPHALEXIN 500 MG PO CAPS
500.0000 mg | ORAL_CAPSULE | Freq: Once | ORAL | Status: AC
  Administered 2015-12-22: 500 mg via ORAL
  Filled 2015-12-22: qty 1

## 2015-12-22 MED ORDER — CEPHALEXIN 500 MG PO CAPS: 500.0000 mg | ORAL_CAPSULE | Freq: Two times a day (BID) | ORAL | 0 refills | Status: DC

## 2015-12-22 NOTE — ED Triage Notes (Signed)
Reports light pink spotting. Pt states she is [redacted] wks pregnant

## 2015-12-22 NOTE — ED Provider Notes (Signed)
[facility] Emergency Department Provider Note ____________________________________________   I have reviewed the triage vital signs and the triage nursing note.  HISTORY  Chief Complaint Vaginal Bleeding   Historian Patient  HPI Carla Nichols is a 25 y.o. female G1 P0, found out she was pregnant yesterday when she took a home pregnancy test. She states her last normal period was around July 23, and then she started spotting at the end of August. She is here because of mild lower pelvic cramping, spotting which she describes as right pink. She has been treated for Chlamydia in the past. Denies urinary symptoms. Symptoms are mild. Denies vaginal discharge.    Past Medical History:  Diagnosis Date  . Asthma   . Diabetes mellitus without complication (HCC)   . Hypertension     There are no active problems to display for this patient.   Past Surgical History:  Procedure Laterality Date  . TONSILLECTOMY      Prior to Admission medications   Medication Sig Start Date End Date Taking? Authorizing Provider  famotidine (PEPCID) 40 MG tablet Take 1 tablet (40 mg total) by mouth every evening. 09/20/15 09/19/16  Chinita Pester, FNP  naproxen (NAPROSYN) 500 MG tablet Take 1 tablet (500 mg total) by mouth 2 (two) times daily with a meal. 09/28/15   Jami L Hagler, PA-C    No Known Allergies  No family history on file.  Social History Social History  Substance Use Topics  . Smoking status: Current Every Day Smoker  . Smokeless tobacco: Never Used  . Alcohol use Yes    Review of Systems  Constitutional: Negative for fever. Eyes: Negative for visual changes. ENT: Negative for sore throat. Cardiovascular: Negative for chest pain. Respiratory: Negative for shortness of breath. Gastrointestinal: Negative for vomiting and diarrhea. Genitourinary: Negative for dysuria. Musculoskeletal: Negative for back pain. Skin: Negative for rash. Neurological:  Negative for headache. 10 point Review of Systems otherwise negative ____________________________________________   PHYSICAL EXAM:  VITAL SIGNS: ED Triage Vitals  Enc Vitals Group     BP 12/22/15 1037 (!) 98/58     Pulse Rate 12/22/15 1037 75     Resp 12/22/15 1037 18     Temp 12/22/15 1037 98.7 F (37.1 C)     Temp Source 12/22/15 1037 Oral     SpO2 12/22/15 1037 100 %     Weight 12/22/15 1038 270 lb (122.5 kg)     Height 12/22/15 1038 5\' 3"  (1.6 m)     Head Circumference --      Peak Flow --      Pain Score --      Pain Loc --      Pain Edu? --      Excl. in GC? --      Constitutional: Alert and oriented. Well appearing and in no distress. HEENT   Head: Normocephalic and atraumatic.      Eyes: Conjunctivae are normal. PERRL. Normal extraocular movements.      Ears:         Nose: No congestion/rhinnorhea.   Mouth/Throat: Mucous membranes are moist.   Neck: No stridor. Cardiovascular/Chest: Normal rate, regular rhythm.  No murmurs, rubs, or gallops. Respiratory: Normal respiratory effort without tachypnea nor retractions. Breath sounds are clear and equal bilaterally. No wheezes/rales/rhonchi. Gastrointestinal: Soft. No distention, no guarding, no rebound. Obese. Very mild discomfort in the pelvic area.  Genitourinary/rectal:  Small amount of bloody discharge, closed os.  Nontender cervix. Musculoskeletal: Nontender  with normal range of motion in all extremities. No joint effusions.  No lower extremity tenderness.  No edema. Neurologic:  Normal speech and language. No gross or focal neurologic deficits are appreciated. Skin:  Skin is warm, dry and intact. No rash noted. Psychiatric: Mood and affect are normal. Speech and behavior are normal. Patient exhibits appropriate insight and judgment.  ____________________________________________   EKG I, Governor Rooksebecca Jennell Janosik, MD, the attending physician have personally viewed and interpreted all  ECGs.  None ____________________________________________  LABS (pertinent positives/negatives)  Labs Reviewed  WET PREP, GENITAL - Abnormal; Notable for the following:       Result Value   Clue Cells Wet Prep HPF POC FEW (*)    WBC, Wet Prep HPF POC MODERATE (*)    All other components within normal limits  COMPREHENSIVE METABOLIC PANEL - Abnormal; Notable for the following:    Anion gap 4 (*)    All other components within normal limits  HCG, QUANTITATIVE, PREGNANCY - Abnormal; Notable for the following:    hCG, Beta Chain, Quant, S 1,528 (*)    All other components within normal limits  URINALYSIS COMPLETEWITH MICROSCOPIC ([facility] ONLY) - Abnormal; Notable for the following:    Color, Urine YELLOW (*)    APPearance CLEAR (*)    Hgb urine dipstick 3+ (*)    Leukocytes, UA TRACE (*)    Squamous Epithelial / LPF 0-5 (*)    All other components within normal limits  CHLAMYDIA/NGC RT PCR ([facility] ONLY)  CBC  ABO/RH    ____________________________________________  RADIOLOGY All Xrays were viewed by me. Imaging interpreted by Radiologist.  Ultrasound pelvic and transvaginal:  Probable single intrauterine gestational sac, measuring 5 weeks 0 days by mean sac diameter.  No yolk sac or fetal pole. Consider follow-up pelvic ultrasound in 14 days to confirm viability, as clinically warranted. __________________________________________  PROCEDURES  Procedure(s) performed: None  Critical Care performed: None  ____________________________________________   ED COURSE / ASSESSMENT AND PLAN  Pertinent labs & imaging results that were available during my care of the patient were reviewed by me and considered in my medical decision making (see chart for details).  Ms. Carla Nichols is here for vaginal spotting and presumed approximately 7 weeks pregnancy by her last full menstrual period.  Overall well-appearing, benign abdominal exam but she reports some cramping.  On pelvic  exam no evidence of cervicitis and just mild spotting/vaginal bleeding.  Blood type A+.  Beta hCG 1500 and ultrasound shows sac 5 weeks age. Discussed with the patient that this could be filled pregnancy, or just early pregnancy.  Wet prep consistent with potential vaginosis, and asked the patient to follow up with OB/GYN and discussed treatment now versus later in the pregnancy. She is essentially asymptomatic.    CONSULTATIONS:   None Patient / Family / Caregiver informed of clinical course, medical decision-making process, and agree with plan.   I discussed return precautions, follow-up instructions, and discharged instructions with patient and/or family.   ___________________________________________   FINAL CLINICAL IMPRESSION(S) / ED DIAGNOSES   Final diagnoses:  Threatened miscarriage  First trimester pregnancy  Bacterial vaginosis              Note: This dictation was prepared with Dragon dictation. Any transcriptional errors that result from this process are unintentional    Governor Rooksebecca Rossie Bretado, MD 12/22/15 1450

## 2015-12-22 NOTE — Discharge Instructions (Addendum)
You were evaluated for bleeding in early pregnancy.  As we discussed, the sac on ultrasound measured approximately 5 weeks, and this might be because you are so early that we cannot confirm the heart beat yet, and as he follow-up with OB/GYN a will be able to repeat ultrasound in a few weeks to see how things are progressing. It is also possible that the sac may not be progressing normally and that this may result in a miscarriage.   Return to the emergency department for any worsening condition including uncontrolled pain, bleeding heavier than a normal period or saturating 1 pad per hour for several hours, dizziness, passing out, chest pain, or any other symptoms concerning to you.  You need follow-up with an OB/GYN doctor, and Dr. Francena HanlyBeasley's office number is provided.  Your vaginal swab showed bacterial vaginosis, discuss whether to treat this now or wait until later in the pregnancy at the OB/GYN at the appointment.  You're being treated for possible urinary tract infection with antibiotic Keflex.

## 2015-12-23 LAB — URINE CULTURE

## 2016-01-07 ENCOUNTER — Encounter: Payer: Self-pay | Admitting: Emergency Medicine

## 2016-01-07 ENCOUNTER — Emergency Department: Payer: Medicaid Other

## 2016-01-07 ENCOUNTER — Emergency Department
Admission: EM | Admit: 2016-01-07 | Discharge: 2016-01-07 | Disposition: A | Discharge status: Home / Self Care | Payer: Medicaid Other | Attending: Emergency Medicine | Admitting: Emergency Medicine

## 2016-01-07 DIAGNOSIS — O26891 Other specified pregnancy related conditions, first trimester: Secondary | ICD-10-CM | POA: Insufficient documentation

## 2016-01-07 DIAGNOSIS — Z79899 Other long term (current) drug therapy: Secondary | ICD-10-CM | POA: Insufficient documentation

## 2016-01-07 DIAGNOSIS — Z792 Long term (current) use of antibiotics: Secondary | ICD-10-CM | POA: Insufficient documentation

## 2016-01-07 DIAGNOSIS — J45909 Unspecified asthma, uncomplicated: Secondary | ICD-10-CM | POA: Insufficient documentation

## 2016-01-07 DIAGNOSIS — Z87891 Personal history of nicotine dependence: Secondary | ICD-10-CM | POA: Diagnosis not present

## 2016-01-07 DIAGNOSIS — O039 Complete or unspecified spontaneous abortion without complication: Secondary | ICD-10-CM | POA: Diagnosis not present

## 2016-01-07 DIAGNOSIS — E119 Type 2 diabetes mellitus without complications: Secondary | ICD-10-CM | POA: Diagnosis not present

## 2016-01-07 DIAGNOSIS — R102 Pelvic and perineal pain: Secondary | ICD-10-CM | POA: Insufficient documentation

## 2016-01-07 DIAGNOSIS — R197 Diarrhea, unspecified: Secondary | ICD-10-CM | POA: Diagnosis not present

## 2016-01-07 DIAGNOSIS — I10 Essential (primary) hypertension: Secondary | ICD-10-CM | POA: Diagnosis not present

## 2016-01-07 DIAGNOSIS — O209 Hemorrhage in early pregnancy, unspecified: Secondary | ICD-10-CM | POA: Diagnosis present

## 2016-01-07 LAB — URINALYSIS COMPLETE WITH MICROSCOPIC (ARMC ONLY)
BACTERIA UA: NONE SEEN
Bilirubin Urine: NEGATIVE
GLUCOSE, UA: NEGATIVE mg/dL
Ketones, ur: NEGATIVE mg/dL
NITRITE: NEGATIVE
PH: 8 (ref 5.0–8.0)
Protein, ur: 100 mg/dL — AB
SPECIFIC GRAVITY, URINE: 1.028 (ref 1.005–1.030)

## 2016-01-07 LAB — CBC
HEMATOCRIT: 41.8 % (ref 35.0–47.0)
Hemoglobin: 14.5 g/dL (ref 12.0–16.0)
MCH: 31.1 pg (ref 26.0–34.0)
MCHC: 34.6 g/dL (ref 32.0–36.0)
MCV: 89.9 fL (ref 80.0–100.0)
PLATELETS: 173 10*3/uL (ref 150–440)
RBC: 4.65 MIL/uL (ref 3.80–5.20)
RDW: 13.2 % (ref 11.5–14.5)
WBC: 9.1 10*3/uL (ref 3.6–11.0)

## 2016-01-07 LAB — WET PREP, GENITAL
Clue Cells Wet Prep HPF POC: NONE SEEN
Sperm: NONE SEEN
Trich, Wet Prep: NONE SEEN
Yeast Wet Prep HPF POC: NONE SEEN

## 2016-01-07 LAB — COMPREHENSIVE METABOLIC PANEL
ALBUMIN: 3.9 g/dL (ref 3.5–5.0)
ALK PHOS: 43 U/L (ref 38–126)
ALT: 15 U/L (ref 14–54)
AST: 19 U/L (ref 15–41)
Anion gap: 4 — ABNORMAL LOW (ref 5–15)
BILIRUBIN TOTAL: 0.5 mg/dL (ref 0.3–1.2)
BUN: 10 mg/dL (ref 6–20)
CALCIUM: 9 mg/dL (ref 8.9–10.3)
CO2: 29 mmol/L (ref 22–32)
CREATININE: 0.61 mg/dL (ref 0.44–1.00)
Chloride: 103 mmol/L (ref 101–111)
GFR calc Af Amer: >60 mL/min (ref >60)
GLUCOSE: 72 mg/dL (ref 65–99)
Potassium: 3.9 mmol/L (ref 3.5–5.1)
Sodium: 136 mmol/L (ref 135–145)
TOTAL PROTEIN: 7.4 g/dL (ref 6.5–8.1)

## 2016-01-07 LAB — CHLAMYDIA/NGC RT PCR (ARMC ONLY)
Chlamydia Tr: NOT DETECTED
N gonorrhoeae: NOT DETECTED

## 2016-01-07 LAB — HCG, QUANTITATIVE, PREGNANCY: hCG, Beta Chain, Quant, S: 32457 m[IU]/mL — ABNORMAL HIGH (ref <5)

## 2016-01-07 LAB — LIPASE, BLOOD: Lipase: 22 U/L (ref 11–51)

## 2016-01-07 MED ORDER — OXYCODONE-ACETAMINOPHEN 5-325 MG PO TABS: 2.0000 | ORAL_TABLET | Freq: Four times a day (QID) | ORAL | 0 refills | Status: DC | PRN

## 2016-01-07 NOTE — ED Notes (Signed)
She is currently in [place] 

## 2016-01-07 NOTE — ED Triage Notes (Signed)
Pt reports approx [redacted] weeks pregnant with vaginal bleeding that has been going on for about 1 month, but has worsened and now has vaginal odor. Pt also reports back pain.

## 2016-01-07 NOTE — ED Notes (Signed)
Pt ambulatory to lobby. NAD noted. 

## 2016-01-07 NOTE — ED Provider Notes (Signed)
[facility] Emergency Department Provider Note        Time seen: ----------------------------------------- 11:27 AM on 01/07/2016 -----------------------------------------    I have reviewed the triage vital signs and the nursing notes.   HISTORY  Chief Complaint Vaginal Bleeding    HPI Carla Nichols is a 25 y.o. female who presents to ER stating she is around [redacted] weeks pregnant and she's had vaginal bleeding this been going on for the last month but has worsened and now she has vaginal odor. Patient also reports back pain. She was seen here 2 weeks ago for similar, had a suspected five-week pregnancy on ultrasound.Patient reports a "sour" smelling vaginal discharge that she states is not normal. Patient has not been able to set up follow-up with OB/GYN due to insurance reasons.   Past Medical History:  Diagnosis Date  . Asthma   . Diabetes mellitus without complication (HCC)   . Hypertension     There are no active problems to display for this patient.   Past Surgical History:  Procedure Laterality Date  . TONSILLECTOMY      Allergies Review of patient's allergies indicates no known allergies.  Social History Social History  Substance Use Topics  . Smoking status: Former Games developermoker  . Smokeless tobacco: Never Used  . Alcohol use Yes    Review of Systems Constitutional: Negative for fever. Cardiovascular: Negative for chest pain. Respiratory: Negative for shortness of breath. Gastrointestinal: Negative for abdominal pain, vomiting. Positive for diarrhea Genitourinary: Negative for dysuria.Positive for vaginal spotting, vaginal discharge Musculoskeletal: Positive for back pain Skin: Negative for rash. Neurological: Negative for headaches, focal weakness or numbness.  10-point ROS otherwise negative.  ____________________________________________   PHYSICAL EXAM:  VITAL SIGNS: ED Triage Vitals [01/07/16 1010]  Enc Vitals Group      BP 140/68     Pulse Rate 73     Resp 18     Temp 97.7 F (36.5 C)     Temp Source Oral     SpO2 100 %     Weight 278 lb (126.1 kg)     Height 5\' 3"  (1.6 m)     Head Circumference      Peak Flow      Pain Score 8     Pain Loc      Pain Edu?      Excl. in GC?     Constitutional: Alert and oriented. Well appearing and in no distress. Eyes: Conjunctivae are normal. PERRL. Normal extraocular movements. ENT   Head: Normocephalic and atraumatic.   Nose: No congestion/rhinnorhea.   Mouth/Throat: Mucous membranes are moist.   Neck: No stridor. Cardiovascular: Normal rate, regular rhythm. No murmurs, rubs, or gallops. Respiratory: Normal respiratory effort without tachypnea nor retractions. Breath sounds are clear and equal bilaterally. No wheezes/rales/rhonchi. Gastrointestinal: Soft and nontender. Normal bowel sounds Musculoskeletal: Nontender with normal range of motion in all extremities. No lower extremity tenderness nor edema. Neurologic:  Normal speech and language. No gross focal neurologic deficits are appreciated.  Skin:  Skin is warm, dry and intact. No rash noted. Psychiatric: Mood and affect are normal. Speech and behavior are normal.  ____________________________________________  ED COURSE:  Pertinent labs & imaging results that were available during my care of the patient were reviewed by me and considered in my medical decision making (see chart for details). Clinical Course  Patient is in no distress, we will assess with a sick labs, pelvic examination and ultrasound.  Procedures ____________________________________________  LABS (pertinent positives/negatives)  Labs Reviewed  WET PREP, GENITAL - Abnormal; Notable for the following:       Result Value   WBC, Wet Prep HPF POC FEW (*)    All other components within normal limits  COMPREHENSIVE METABOLIC PANEL - Abnormal; Notable for the following:    Anion gap 4 (*)    All other components  within normal limits  URINALYSIS COMPLETEWITH MICROSCOPIC ([facility] ONLY) - Abnormal; Notable for the following:    Color, Urine YELLOW (*)    APPearance CLOUDY (*)    Hgb urine dipstick 2+ (*)    Protein, ur 100 (*)    Leukocytes, UA TRACE (*)    Squamous Epithelial / LPF 0-5 (*)    All other components within normal limits  HCG, QUANTITATIVE, PREGNANCY - Abnormal; Notable for the following:    hCG, Beta Chain, Quant, S 32,457 (*)    All other components within normal limits  CHLAMYDIA/NGC RT PCR ([facility] ONLY)  LIPASE, BLOOD  CBC    RADIOLOGY  Pelvic ultrasound  IMPRESSION: No intrauterine gestational visualized. Large echogenic material within the endometrium likely reflects blood products as no internal blood flow is seen. ____________________________________________  FINAL ASSESSMENT AND PLAN  Miscarriage  Plan: Patient with labs and imaging as dictated above. Given the above ultrasound findings, this represents miscarriage in process. Patient will be given pain medicine, encouraged to have close outpatient follow-up.   Emily Filbert, MD   Note: This dictation was prepared with Dragon dictation. Any transcriptional errors that result from this process are unintentional    Emily Filbert, MD 01/07/16 1330

## 2016-01-10 ENCOUNTER — Other Ambulatory Visit
Admission: RE | Admit: 2016-01-10 | Discharge: 2016-01-10 | Disposition: A | Discharge status: Home / Self Care | Payer: Medicaid Other | Source: Ambulatory Visit | Attending: Certified Nurse Midwife | Admitting: Certified Nurse Midwife

## 2016-01-10 DIAGNOSIS — Z3A00 Weeks of gestation of pregnancy not specified: Secondary | ICD-10-CM | POA: Insufficient documentation

## 2016-01-10 DIAGNOSIS — O200 Threatened abortion: Secondary | ICD-10-CM | POA: Diagnosis not present

## 2016-01-10 LAB — HCG, QUANTITATIVE, PREGNANCY: hCG, Beta Chain, Quant, S: 42510 m[IU]/mL — ABNORMAL HIGH (ref <5)

## 2016-01-24 ENCOUNTER — Ambulatory Visit: Payer: Medicaid Other | Admitting: Anesthesiology

## 2016-01-24 ENCOUNTER — Encounter
Admission: RE | Disposition: A | Discharge status: Home / Self Care | Payer: Self-pay | Source: Ambulatory Visit | Attending: Obstetrics & Gynecology

## 2016-01-24 ENCOUNTER — Encounter: Payer: Self-pay | Admitting: *Deleted

## 2016-01-24 ENCOUNTER — Observation Stay
Admission: RE | Admit: 2016-01-24 | Discharge: 2016-01-25 | Disposition: A | Discharge status: Home / Self Care | Payer: Medicaid Other | Source: Ambulatory Visit | Attending: Obstetrics & Gynecology | Admitting: Obstetrics & Gynecology

## 2016-01-24 DIAGNOSIS — D649 Anemia, unspecified: Secondary | ICD-10-CM | POA: Diagnosis not present

## 2016-01-24 DIAGNOSIS — O010 Classical hydatidiform mole: Principal | ICD-10-CM | POA: Insufficient documentation

## 2016-01-24 DIAGNOSIS — Z9889 Other specified postprocedural states: Secondary | ICD-10-CM

## 2016-01-24 DIAGNOSIS — N920 Excessive and frequent menstruation with regular cycle: Secondary | ICD-10-CM | POA: Insufficient documentation

## 2016-01-24 DIAGNOSIS — I10 Essential (primary) hypertension: Secondary | ICD-10-CM | POA: Diagnosis not present

## 2016-01-24 DIAGNOSIS — R58 Hemorrhage, not elsewhere classified: Secondary | ICD-10-CM

## 2016-01-24 DIAGNOSIS — Z3A08 8 weeks gestation of pregnancy: Secondary | ICD-10-CM | POA: Insufficient documentation

## 2016-01-24 DIAGNOSIS — O021 Missed abortion: Secondary | ICD-10-CM | POA: Diagnosis present

## 2016-01-24 DIAGNOSIS — Z91030 Bee allergy status: Secondary | ICD-10-CM | POA: Insufficient documentation

## 2016-01-24 DIAGNOSIS — O020 Blighted ovum and nonhydatidiform mole: Secondary | ICD-10-CM

## 2016-01-24 DIAGNOSIS — R55 Syncope and collapse: Secondary | ICD-10-CM

## 2016-01-24 DIAGNOSIS — J45909 Unspecified asthma, uncomplicated: Secondary | ICD-10-CM | POA: Diagnosis not present

## 2016-01-24 DIAGNOSIS — R634 Abnormal weight loss: Secondary | ICD-10-CM

## 2016-01-24 DIAGNOSIS — F17210 Nicotine dependence, cigarettes, uncomplicated: Secondary | ICD-10-CM

## 2016-01-24 DIAGNOSIS — R42 Dizziness and giddiness: Secondary | ICD-10-CM

## 2016-01-24 DIAGNOSIS — K219 Gastro-esophageal reflux disease without esophagitis: Secondary | ICD-10-CM | POA: Diagnosis not present

## 2016-01-24 DIAGNOSIS — Z91013 Allergy to seafood: Secondary | ICD-10-CM | POA: Diagnosis not present

## 2016-01-24 DIAGNOSIS — Z87891 Personal history of nicotine dependence: Secondary | ICD-10-CM | POA: Diagnosis not present

## 2016-01-24 DIAGNOSIS — D500 Iron deficiency anemia secondary to blood loss (chronic): Secondary | ICD-10-CM

## 2016-01-24 DIAGNOSIS — R0602 Shortness of breath: Secondary | ICD-10-CM

## 2016-01-24 DIAGNOSIS — O009 Unspecified ectopic pregnancy without intrauterine pregnancy: Secondary | ICD-10-CM

## 2016-01-24 DIAGNOSIS — Z6841 Body Mass Index (BMI) 40.0 and over, adult: Secondary | ICD-10-CM | POA: Diagnosis not present

## 2016-01-24 DIAGNOSIS — R5383 Other fatigue: Secondary | ICD-10-CM

## 2016-01-24 DIAGNOSIS — E119 Type 2 diabetes mellitus without complications: Secondary | ICD-10-CM | POA: Diagnosis not present

## 2016-01-24 HISTORY — DX: Unspecified convulsions: R56.9

## 2016-01-24 HISTORY — DX: Dyspnea, unspecified: R06.00

## 2016-01-24 HISTORY — PX: DILATION AND EVACUATION: SHX1459

## 2016-01-24 LAB — CBC
HEMATOCRIT: 40.1 % (ref 35.0–47.0)
HEMOGLOBIN: 13.5 g/dL (ref 12.0–16.0)
MCH: 30.5 pg (ref 26.0–34.0)
MCHC: 33.8 g/dL (ref 32.0–36.0)
MCV: 90.3 fL (ref 80.0–100.0)
Platelets: 170 10*3/uL (ref 150–440)
RBC: 4.43 MIL/uL (ref 3.80–5.20)
RDW: 13.4 % (ref 11.5–14.5)
WBC: 10 10*3/uL (ref 3.6–11.0)

## 2016-01-24 LAB — IRON AND TIBC
IRON: 49 ug/dL (ref 28–170)
Saturation Ratios: 25 % (ref 10.4–31.8)
TIBC: 200 ug/dL — AB (ref 250–450)
UIBC: 151 ug/dL

## 2016-01-24 LAB — HEMOGLOBIN AND HEMATOCRIT, BLOOD
HEMATOCRIT: 15.5 % — AB (ref 35.0–47.0)
HEMOGLOBIN: 5.1 g/dL — AB (ref 12.0–16.0)

## 2016-01-24 LAB — TYPE AND SCREEN
ABO/RH(D): A POS
Antibody Screen: NEGATIVE

## 2016-01-24 LAB — FERRITIN: Ferritin: 73 ng/mL (ref 11–307)

## 2016-01-24 LAB — GLUCOSE, CAPILLARY
GLUCOSE-CAPILLARY: 104 mg/dL — AB (ref 65–99)
Glucose-Capillary: 73 mg/dL (ref 65–99)
Glucose-Capillary: 104 mg/dL — ABNORMAL HIGH (ref 65–99)

## 2016-01-24 SURGERY — DILATION AND EVACUATION, UTERUS
Anesthesia: General

## 2016-01-24 MED ORDER — OXYTOCIN 10 UNIT/ML IJ SOLN
INTRAMUSCULAR | Status: AC
  Filled 2016-01-24: qty 2

## 2016-01-24 MED ORDER — METHYLERGONOVINE MALEATE 0.2 MG/ML IJ SOLN
INTRAMUSCULAR | Status: AC
  Filled 2016-01-24: qty 1

## 2016-01-24 MED ORDER — ONDANSETRON HCL 4 MG/2ML IJ SOLN: 4.0000 mg | Freq: Four times a day (QID) | INTRAMUSCULAR | Status: DC | PRN

## 2016-01-24 MED ORDER — FAMOTIDINE 20 MG PO TABS
ORAL_TABLET | ORAL | Status: AC
  Filled 2016-01-24: qty 1

## 2016-01-24 MED ORDER — OXYTOCIN 10 UNIT/ML IJ SOLN
INTRAMUSCULAR | Status: AC
  Filled 2016-01-24: qty 1

## 2016-01-24 MED ORDER — FENTANYL CITRATE (PF) 100 MCG/2ML IJ SOLN
25.0000 ug | INTRAMUSCULAR | Status: DC | PRN
  Administered 2016-01-24 (×3): 50 ug via INTRAVENOUS

## 2016-01-24 MED ORDER — GLYCOPYRROLATE 0.2 MG/ML IJ SOLN
INTRAMUSCULAR | Status: DC | PRN
  Administered 2016-01-24: 0.2 mg via INTRAVENOUS

## 2016-01-24 MED ORDER — IBUPROFEN 400 MG PO TABS: 400.0000 mg | ORAL_TABLET | Freq: Four times a day (QID) | ORAL | 0 refills | Status: DC | PRN

## 2016-01-24 MED ORDER — PHENYLEPHRINE HCL 10 MG/ML IJ SOLN
INTRAMUSCULAR | Status: DC | PRN
  Administered 2016-01-24: 100 ug via INTRAVENOUS

## 2016-01-24 MED ORDER — ONDANSETRON HCL 4 MG PO TABS: 4.0000 mg | ORAL_TABLET | Freq: Four times a day (QID) | ORAL | Status: DC | PRN

## 2016-01-24 MED ORDER — ACETAMINOPHEN 325 MG PO TABS: 650.0000 mg | ORAL_TABLET | ORAL | Status: DC | PRN

## 2016-01-24 MED ORDER — ACETAMINOPHEN 325 MG PO TABS
650.0000 mg | ORAL_TABLET | ORAL | Status: DC | PRN
  Administered 2016-01-24: 650 mg via ORAL
  Filled 2016-01-24: qty 2

## 2016-01-24 MED ORDER — KETOROLAC TROMETHAMINE 30 MG/ML IJ SOLN
30.0000 mg | Freq: Four times a day (QID) | INTRAMUSCULAR | Status: DC
  Administered 2016-01-24 – 2016-01-25 (×4): 30 mg via INTRAVENOUS
  Filled 2016-01-24 (×4): qty 1

## 2016-01-24 MED ORDER — DOXYCYCLINE HYCLATE 100 MG IV SOLR
100.0000 mg | Freq: Once | INTRAVENOUS | Status: AC
  Administered 2016-01-24: 100 mg via INTRAVENOUS
  Filled 2016-01-24: qty 100

## 2016-01-24 MED ORDER — ONDANSETRON HCL 4 MG/2ML IJ SOLN
INTRAMUSCULAR | Status: AC
  Administered 2016-01-24: 4 mg
  Filled 2016-01-24: qty 2

## 2016-01-24 MED ORDER — ONDANSETRON HCL 4 MG/2ML IJ SOLN
INTRAMUSCULAR | Status: DC | PRN
  Administered 2016-01-24: 4 mg via INTRAVENOUS

## 2016-01-24 MED ORDER — DOXYCYCLINE HYCLATE 100 MG PO CAPS: 100.0000 mg | ORAL_CAPSULE | Freq: Two times a day (BID) | ORAL | 0 refills | Status: DC

## 2016-01-24 MED ORDER — FAMOTIDINE 20 MG PO TABS
20.0000 mg | ORAL_TABLET | Freq: Once | ORAL | Status: AC
  Administered 2016-01-24: 20 mg via ORAL

## 2016-01-24 MED ORDER — MIDAZOLAM HCL 2 MG/2ML IJ SOLN
INTRAMUSCULAR | Status: DC | PRN
  Administered 2016-01-24: 2 mg via INTRAVENOUS

## 2016-01-24 MED ORDER — ROCURONIUM BROMIDE 100 MG/10ML IV SOLN
INTRAVENOUS | Status: DC | PRN
  Administered 2016-01-24: 40 mg via INTRAVENOUS

## 2016-01-24 MED ORDER — OXYTOCIN 10 UNIT/ML IJ SOLN
INTRAMUSCULAR | Status: DC | PRN
  Administered 2016-01-24: 20 [IU] via INTRAMUSCULAR

## 2016-01-24 MED ORDER — SUGAMMADEX SODIUM 500 MG/5ML IV SOLN
INTRAVENOUS | Status: DC | PRN
  Administered 2016-01-24: 504.4 mg via INTRAVENOUS

## 2016-01-24 MED ORDER — FENTANYL CITRATE (PF) 100 MCG/2ML IJ SOLN
INTRAMUSCULAR | Status: AC
  Filled 2016-01-24: qty 2

## 2016-01-24 MED ORDER — METHYLERGONOVINE MALEATE 0.2 MG/ML IJ SOLN
INTRAMUSCULAR | Status: DC | PRN
  Administered 2016-01-24: 0.2 mg via INTRAMUSCULAR

## 2016-01-24 MED ORDER — ALBUMIN HUMAN 5 % IV SOLN
INTRAVENOUS | Status: DC | PRN
  Administered 2016-01-24 (×2): via INTRAVENOUS

## 2016-01-24 MED ORDER — ALBUMIN HUMAN 5 % IV SOLN
INTRAVENOUS | Status: AC
  Filled 2016-01-24: qty 250

## 2016-01-24 MED ORDER — ACETAMINOPHEN 650 MG RE SUPP
650.0000 mg | RECTAL | Status: DC | PRN
  Filled 2016-01-24: qty 1

## 2016-01-24 MED ORDER — IBUPROFEN 400 MG PO TABS: 400.0000 mg | ORAL_TABLET | Freq: Four times a day (QID) | ORAL | Status: DC | PRN

## 2016-01-24 MED ORDER — METHYLERGONOVINE MALEATE 0.2 MG PO TABS
0.2000 mg | ORAL_TABLET | ORAL | Status: AC
  Administered 2016-01-24 – 2016-01-25 (×4): 0.2 mg via ORAL
  Filled 2016-01-24 (×4): qty 1

## 2016-01-24 MED ORDER — METHYLERGONOVINE MALEATE 0.2 MG PO TABS: 0.2000 mg | ORAL_TABLET | ORAL | 0 refills | Status: DC

## 2016-01-24 MED ORDER — FENTANYL CITRATE (PF) 100 MCG/2ML IJ SOLN
INTRAMUSCULAR | Status: DC | PRN
  Administered 2016-01-24: 25 ug via INTRAVENOUS
  Administered 2016-01-24: 50 ug via INTRAVENOUS
  Administered 2016-01-24: 100 ug via INTRAVENOUS
  Administered 2016-01-24: 25 ug via INTRAVENOUS

## 2016-01-24 MED ORDER — LIDOCAINE HCL (CARDIAC) 20 MG/ML IV SOLN
INTRAVENOUS | Status: DC | PRN
  Administered 2016-01-24: 100 mg via INTRAVENOUS

## 2016-01-24 MED ORDER — OXYCODONE HCL 5 MG PO TABS: 5.0000 mg | ORAL_TABLET | Freq: Once | ORAL | Status: DC | PRN

## 2016-01-24 MED ORDER — OXYCODONE HCL 5 MG/5ML PO SOLN: 5.0000 mg | Freq: Once | ORAL | Status: DC | PRN

## 2016-01-24 MED ORDER — SUCCINYLCHOLINE CHLORIDE 20 MG/ML IJ SOLN
INTRAMUSCULAR | Status: DC | PRN
  Administered 2016-01-24: 100 mg via INTRAVENOUS

## 2016-01-24 MED ORDER — 0.9 % SODIUM CHLORIDE (POUR BTL) OPTIME
TOPICAL | Status: DC | PRN
  Administered 2016-01-24: 500 mL

## 2016-01-24 MED ORDER — LACTATED RINGERS IV SOLN
INTRAVENOUS | Status: DC | PRN
  Administered 2016-01-24: 13:00:00 via INTRAVENOUS

## 2016-01-24 MED ORDER — FE FUMARATE-B12-VIT C-FA-IFC PO CAPS
1.0000 | ORAL_CAPSULE | Freq: Three times a day (TID) | ORAL | Status: DC
  Administered 2016-01-24 – 2016-01-25 (×3): 1 via ORAL
  Filled 2016-01-24 (×5): qty 1

## 2016-01-24 MED ORDER — SIMETHICONE 80 MG PO CHEW: 80.0000 mg | CHEWABLE_TABLET | Freq: Four times a day (QID) | ORAL | Status: DC | PRN

## 2016-01-24 MED ORDER — PROPOFOL 10 MG/ML IV BOLUS
INTRAVENOUS | Status: DC | PRN
  Administered 2016-01-24: 200 mg via INTRAVENOUS

## 2016-01-24 MED ORDER — DOXYCYCLINE HYCLATE 100 MG PO TABS
100.0000 mg | ORAL_TABLET | Freq: Two times a day (BID) | ORAL | Status: DC
  Administered 2016-01-24 – 2016-01-25 (×2): 100 mg via ORAL
  Filled 2016-01-24 (×2): qty 1

## 2016-01-24 MED ORDER — OXYCODONE-ACETAMINOPHEN 5-325 MG PO TABS
1.0000 | ORAL_TABLET | ORAL | Status: DC | PRN
  Administered 2016-01-24 – 2016-01-25 (×2): 1 via ORAL
  Filled 2016-01-24 (×2): qty 1

## 2016-01-24 MED ORDER — DEXAMETHASONE SODIUM PHOSPHATE 10 MG/ML IJ SOLN
INTRAMUSCULAR | Status: DC | PRN
  Administered 2016-01-24: 5 mg via INTRAVENOUS

## 2016-01-24 MED ORDER — MORPHINE SULFATE (PF) 2 MG/ML IV SOLN: 1.0000 mg | INTRAVENOUS | Status: DC | PRN

## 2016-01-24 MED ORDER — SODIUM CHLORIDE 0.9 % IV SOLN
INTRAVENOUS | Status: DC
  Administered 2016-01-24: 11:00:00 via INTRAVENOUS

## 2016-01-24 MED ORDER — LACTATED RINGERS IV SOLN
INTRAVENOUS | Status: DC
  Administered 2016-01-24 – 2016-01-25 (×2): via INTRAVENOUS

## 2016-01-24 SURGICAL SUPPLY — 27 items
BAG COUNTER SPONGE EZ (MISCELLANEOUS) ×2 IMPLANT
CANISTER SUC SOCK COL 7IN (MISCELLANEOUS) ×3 IMPLANT
CATH ROBINSON RED A/P 16FR (CATHETERS) ×3 IMPLANT
COUNTER SPONGE BAG EZ (MISCELLANEOUS) ×1
FILTER UTR ASPR SPEC (MISCELLANEOUS) ×1 IMPLANT
FLTR UTR ASPR SPEC (MISCELLANEOUS) ×3
GLOVE BIO SURGEON STRL SZ 6.5 (GLOVE) ×4 IMPLANT
GLOVE BIO SURGEON STRL SZ8 (GLOVE) ×3 IMPLANT
GLOVE BIO SURGEONS STRL SZ 6.5 (GLOVE) ×2
GLOVE BIOGEL PI IND STRL 7.0 (GLOVE) ×2 IMPLANT
GLOVE BIOGEL PI INDICATOR 7.0 (GLOVE) ×4
GLOVE INDICATOR 6.5 STRL GRN (GLOVE) ×3 IMPLANT
GOWN STRL REUS W/ TWL LRG LVL3 (GOWN DISPOSABLE) ×1 IMPLANT
GOWN STRL REUS W/ TWL XL LVL3 (GOWN DISPOSABLE) ×1 IMPLANT
GOWN STRL REUS W/TWL LRG LVL3 (GOWN DISPOSABLE) ×2
GOWN STRL REUS W/TWL XL LVL3 (GOWN DISPOSABLE) ×2
KIT BERKELEY 1ST TRIMESTER 3/8 (MISCELLANEOUS) ×3 IMPLANT
KIT RM TURNOVER CYSTO AR (KITS) ×3 IMPLANT
NS IRRIG 500ML POUR BTL (IV SOLUTION) ×3 IMPLANT
PACK DNC HYST (MISCELLANEOUS) ×3 IMPLANT
PAD OB MATERNITY 4.3X12.25 (PERSONAL CARE ITEMS) ×3 IMPLANT
PAD PREP 24X41 OB/GYN DISP (PERSONAL CARE ITEMS) ×3 IMPLANT
SET BERKELEY SUCTION TUBING (SUCTIONS) ×3 IMPLANT
TOWEL OR 17X26 4PK STRL BLUE (TOWEL DISPOSABLE) ×3 IMPLANT
VACURETTE 10 RIGID CVD (CANNULA) IMPLANT
VACURETTE 12 RIGID CVD (CANNULA) ×3 IMPLANT
VACURETTE 8 RIGID CVD (CANNULA) ×3 IMPLANT

## 2016-01-24 NOTE — Discharge Instructions (Signed)
General Gynecological Post-Operative Instructions °You may expect to feel dizzy, weak, and drowsy for as long as 24 hours after receiving the medicine that made you sleep (anesthetic).  °Do not drive a car, ride a bicycle, participate in physical activities, or take public transportation until you are done taking narcotic pain medicines or as directed by your doctor.  °Do not drink alcohol or take tranquilizers.  °Do not take medicine that has not been prescribed by your doctor.  °Do not sign important papers or make important decisions while on narcotic pain medicines.  °Have a responsible person with you.  °CARE OF INCISION  °Keep incision clean and dry. °Take showers instead of baths until your doctor gives you permission to take baths.  °Avoid heavy lifting (more than 10 pounds/4.5 kilograms), pushing, or pulling.  °Avoid activities that may risk injury to your surgical site.  °No sexual intercourse or placement of anything in the vagina for 2 weeks or as instructed by your doctor. °If you have tubes coming from the wound site, check with your doctor regarding appropriate care of the tubes. °Only take prescription or over-the-counter medicines  for pain, discomfort, or fever as directed by your doctor. Do not take aspirin. It can make you bleed. Take medicines (antibiotics) that kill germs if they are prescribed for you.  °Call the office or go to the ER if:  °You feel sick to your stomach (nauseous) and you start to throw up (vomit).  °You have trouble eating or drinking.  °You have an oral temperature above 101.  °You have constipation that is not helped by adjusting diet or increasing fluid intake. Pain medicines are a common cause of constipation.  °You have any other concerns. °SEEK IMMEDIATE MEDICAL CARE IF:  °You have persistent dizziness.  °You have difficulty breathing or a congested sounding (croupy) cough.  °You have an oral temperature above 102.5, not controlled by medicine.  °There is increasing  pain or tenderness near or in the surgical site.  ° ° ° °

## 2016-01-24 NOTE — Progress Notes (Signed)
Day of Surgery Procedure(s) (LRB): DILATATION AND EVACUATION (N/A)  Subjective: Patient reports mild pain/cramping..    Objective: I have reviewed patient's vital signs and labs.  Abd: Min T, ND IExtr: no calf T, no edema  Assessment: s/p Procedure(s): DILATATION AND EVACUATION (N/A): stable, but severe anemia after blood loss from procedure (often associated with molar pregnancy).  Plan: Admit for observation.  As patient refuses transfusion based on religous reasons, will provide po iron therapy and consult hematology for other possible interventions.  Methergine Doxyxyxline IBF or Percocet for pain Monitor for any further significant vaginal blood loss   LOS: 0 days    Carla Nichols 01/24/2016, 1:33 PM

## 2016-01-24 NOTE — Anesthesia Procedure Notes (Signed)
Procedure Name: Intubation Date/Time: 01/24/2016 11:35 AM Performed by: Stormy FabianURTIS, Carla Rampy Pre-anesthesia Checklist: Patient identified, Patient being monitored, Timeout performed, Emergency Drugs available and Suction available Patient Re-evaluated:Patient Re-evaluated prior to inductionOxygen Delivery Method: Circle system utilized Preoxygenation: Pre-oxygenation with 100% oxygen Intubation Type: IV induction Ventilation: Mask ventilation without difficulty Laryngoscope Size: Mac and 3 Grade View: Grade I Tube type: Oral Tube size: 7.0 mm Number of attempts: 1 Airway Equipment and Method: Stylet Placement Confirmation: ETT inserted through vocal cords under direct vision,  positive ETCO2 and breath sounds checked- equal and bilateral Secured at: 23 (23 at the lip) cm Tube secured with: Tape Dental Injury: Teeth and Oropharynx as per pre-operative assessment

## 2016-01-24 NOTE — H&P (Signed)
History and Physical Interval Note:  01/24/2016 11:09 AM  Carla Nichols  has presented today for surgery, with the diagnosis of early pregnancy demise, possible molar pregnancy.  The various methods of treatment have been discussed with the patient and family. After consideration of risks, benefits and other options for treatment, the patient has consented to  Procedure(s): DILATATION AND EVACUATION (N/A) as a surgical intervention .  The patient's history has been reviewed, patient examined, no change in status, stable for surgery.  Pt has the following beta blocker history-  Not taking Beta Blocker.  I have reviewed the patient's chart and labs.  Questions were answered to the patient's satisfaction.       Letitia Libraobert Paul Rhiley Tarver

## 2016-01-24 NOTE — Anesthesia Preprocedure Evaluation (Signed)
Anesthesia Evaluation  Patient identified by MRN, date of birth, ID band Patient awake    Reviewed: Allergy & Precautions, H&P , NPO status , Patient's Chart, lab work & pertinent test results  History of Anesthesia Complications Negative for: history of anesthetic complications  Airway Mallampati: II  TM Distance: >3 FB Neck ROM: full    Dental no notable dental hx. (+) Poor Dentition, Chipped   Pulmonary shortness of breath and with exertion, asthma , former smoker,    Pulmonary exam normal breath sounds clear to auscultation       Cardiovascular Exercise Tolerance: Good hypertension, + DOE  Normal cardiovascular exam Rhythm:regular Rate:Normal     Neuro/Psych Seizures -, Well Controlled,  negative psych ROS   GI/Hepatic Neg liver ROS, GERD  Controlled,  Endo/Other  diabetes, Type obesity  Renal/GU      Musculoskeletal   Abdominal   Peds  Hematology negative hematology ROS (+)   Anesthesia Other Findings Past Medical History: No date: Asthma No date: Diabetes mellitus without complication (HCC) No date: Dyspnea No date: Hypertension No date: Seizures (HCC)     Comment: as a child  Past Surgical History: No date: TONSILLECTOMY  BMI    Body Mass Index:  49.25 kg/m      Reproductive/Obstetrics negative OB ROS                             Anesthesia Physical Anesthesia Plan  ASA: III  Anesthesia Plan: General ETT   Post-op Pain Management:    Induction:   Airway Management Planned:   Additional Equipment:   Intra-op Plan:   Post-operative Plan:   Informed Consent: I have reviewed the patients History and Physical, chart, labs and discussed the procedure including the risks, benefits and alternatives for the proposed anesthesia with the patient or authorized representative who has indicated his/her understanding and acceptance.     Plan Discussed with:  Anesthesiologist, CRNA and Surgeon  Anesthesia Plan Comments:         Anesthesia Quick Evaluation

## 2016-01-24 NOTE — Transfer of Care (Signed)
Immediate Anesthesia Transfer of Care Note  Patient: Carla Nichols  Procedure(s) Performed: Procedure(s): DILATATION AND EVACUATION (N/A)  Patient Location: PACU  Anesthesia Type:General  Level of Consciousness: sedated  Airway & Oxygen Therapy: Patient Spontanous Breathing and Patient connected to face mask oxygen  Post-op Assessment: Report given to RN and Post -op Vital signs reviewed and stable  Post vital signs: Reviewed and stable  Last Vitals:  Vitals:   01/24/16 1028 01/24/16 1251  BP: (!) 126/57 94/60  Pulse: 78 (!) 101  Resp: 16 17  Temp: 36.8 C (!) 35.4 C    Complications: No apparent anesthesia complications

## 2016-01-24 NOTE — Op Note (Signed)
  Operative Note  01/24/2016 12:44 PM  PRE-OP DIAGNOSIS: missed AB and possible molar pregnancy  POST-OP DIAGNOSIS: same  SURGEON: Annamarie MajorPaul Alif Petrak, MD, FACOG  ANESTHESIA: General   PROCEDURE: Procedure(s): DILATATION AND EVACUATION   ESTIMATED BLOOD LOSS: 2000 mL   SPECIMENS: POC  COMPLICATIONS: none  DISPOSITION: PACU - hemodynamically stable.  CONDITION: stable  FINDINGS: Exam under anesthesia revealed a 8 wk size uterus without palpable adnexal masses.   INDICATION FOR PROCEDURE: Beta 60,000, irregular endometrial filling defect and no discernable IUP, vaginal bleeding  PROCEDURE IN DETAIL: After informed consent was obtained, the patient was taken to the operating room where anesthesia was obtained without difficulty. The patient was positioned in the dorsal lithotomy position with [employer]ups. Time out was performed and an exam under anesthesia was performed. The vagina, perineum, and lower abdomen were prepped and draped in a normal sterile fashion. The bladder was emptied with an I&O catheter. A speculum was placed into the vagina and the cervix was grasped with a single toothed tenaculum. The uterus was sounded to 12cm.  The cervix was gently dilated to 20 [place] with  [employer] dilators. The suction was then tested and found to be adequate, and a 8mm rigid suction cannula was advanced into the uterine cavity. The suction was activated and the contents of the uterus were aspirated until no further tissue was obtained. The uterus was then curetted to gritty texture throughout.  At the end of the procedure bleeding was noted to be a Large amount.  All instruments were then removed from the vagina.The patient tolerated the procedure well. All sponge, instrument, and needle counts were correct. The patient was taken to the recovery room in good condition.

## 2016-01-25 DIAGNOSIS — O010 Classical hydatidiform mole: Secondary | ICD-10-CM | POA: Diagnosis not present

## 2016-01-25 LAB — CBC WITH DIFFERENTIAL/PLATELET
BASOS PCT: 1 %
Basophils Absolute: 0.1 10*3/uL (ref 0–0.1)
Eosinophils Absolute: 0 10*3/uL (ref 0–0.7)
Eosinophils Relative: 0 %
HEMATOCRIT: 21.9 % — AB (ref 35.0–47.0)
Hemoglobin: 7.4 g/dL — ABNORMAL LOW (ref 12.0–16.0)
Lymphocytes Relative: 18 %
Lymphs Abs: 2.3 10*3/uL (ref 1.0–3.6)
MCH: 30.2 pg (ref 26.0–34.0)
MCHC: 33.5 g/dL (ref 32.0–36.0)
MCV: 90 fL (ref 80.0–100.0)
MONO ABS: 0.8 10*3/uL (ref 0.2–0.9)
MONOS PCT: 6 %
NEUTROS ABS: 9.8 10*3/uL — AB (ref 1.4–6.5)
Neutrophils Relative %: 75 %
Platelets: 122 10*3/uL — ABNORMAL LOW (ref 150–440)
RBC: 2.44 MIL/uL — ABNORMAL LOW (ref 3.80–5.20)
RDW: 13 % (ref 11.5–14.5)
WBC: 13 10*3/uL — ABNORMAL HIGH (ref 3.6–11.0)

## 2016-01-25 LAB — FOLATE: Folate: 27 ng/mL (ref >5.9)

## 2016-01-25 LAB — RETICULOCYTES
RBC.: 2.44 MIL/uL — ABNORMAL LOW (ref 3.80–5.20)
Retic Count, Absolute: 56.1 10*3/uL (ref 19.0–183.0)
Retic Ct Pct: 2.3 % (ref 0.4–3.1)

## 2016-01-25 LAB — PROTIME-INR
INR: 1.22
Prothrombin Time: 15.5 seconds — ABNORMAL HIGH (ref 11.4–15.2)

## 2016-01-25 LAB — APTT: aPTT: 28 seconds (ref 24–36)

## 2016-01-25 LAB — VITAMIN B12: Vitamin B-12: 342 pg/mL (ref 180–914)

## 2016-01-25 MED ORDER — FE FUMARATE-B12-VIT C-FA-IFC PO CAPS: 1.0000 | ORAL_CAPSULE | Freq: Three times a day (TID) | ORAL | 3 refills | Status: DC

## 2016-01-25 MED ORDER — IBUPROFEN 600 MG PO TABS: 600.0000 mg | ORAL_TABLET | Freq: Four times a day (QID) | ORAL | 0 refills | Status: DC | PRN

## 2016-01-25 MED ORDER — METHYLERGONOVINE MALEATE 0.2 MG PO TABS: 0.2000 mg | ORAL_TABLET | Freq: Three times a day (TID) | ORAL | 0 refills | Status: AC

## 2016-01-25 MED ORDER — LACTATED RINGERS IV BOLUS (SEPSIS)
500.0000 mL | Freq: Once | INTRAVENOUS | Status: AC
  Administered 2016-01-25: 500 mL via INTRAVENOUS

## 2016-01-25 MED ORDER — METRONIDAZOLE 500 MG PO TABS: 500.0000 mg | ORAL_TABLET | Freq: Two times a day (BID) | ORAL | 0 refills | Status: AC

## 2016-01-25 NOTE — Progress Notes (Signed)
Min bleeding +ambulation without dizziness BP low at times, no tachycardia No reported SOB, CP Cont fluid therapy, recheck labs in am

## 2016-01-25 NOTE — Plan of Care (Signed)
Problem: Education: Goal: Knowledge of [facility] Education information/materials will improve Outcome: Progressing Pt care and treatment reviewed, spoke with Dr. Sheryle Hailiamond about anemia, cont to monitor pt. Pt asymptomatic.   Problem: Safety: Goal: Ability to remain free from injury will improve Outcome: Progressing Pt remains free from falls, call bell within reach. Pt encouraged to call for oob assistance.  Problem: Pain Managment: Goal: General experience of comfort will improve Outcome: Progressing Pt with c/o pain 5/10, prn meds available upon request along with scheduled Toradol.   Problem: Physical Regulation: Goal: Ability to maintain clinical measurements within normal limits will improve Outcome: Progressing Pt ambulated in hallway with stand by assist. Goal: Will remain free from infection Outcome: Progressing Monitor WBC, pt afebrile. Cont to monitor and assess.   Problem: Skin Integrity: Goal: Risk for impaired skin integrity will decrease Outcome: Progressing Skin intact.  Problem: Tissue Perfusion: Goal: Risk factors for ineffective tissue perfusion will decrease Outcome: Progressing Pt with soft bp, MD aware. Bolus given per MD order, spoke with prime doc about pt status. Pt denies SOB, CP or palpatations. HR 70-80s, SBP 90s, no futher IV boluses recommended.   Problem: Activity: Goal: Risk for activity intolerance will decrease Outcome: Progressing Pt amb in hallway.  Problem: Fluid Volume: Goal: Ability to maintain a balanced intake and output will improve Outcome: Progressing SBP 120 post LR bolus, HR 70s prior to bolus.  Problem: Nutrition: Goal: Adequate nutrition will be maintained Outcome: Progressing ADAT, pt denies N/V.

## 2016-01-25 NOTE — Consult Note (Signed)
[facility]  Date of admission:  01/24/2016  Inpatient day:  01/24/2016  Consulting physician: Dr. Velora Mediateobert Harris   Reason for Consultation:  Anemia after D&C for molar pregnancy/loss; declines blood transfusion due to religious reasons  Chief Complaint: Carla Nichols is a 25 y.o. female admitted for dilatation and evacuation.  HPI: The patient notes a long-standing history of heavy menses.  She states that menses was constant for 2 years resulting in an IUD being placed at age 25 or [age]. Two years ago, her IUD was removed. She was started on Depot Provera. She notes some bruising on her legs without known trauma and periodic epistaxis. She had tonsillectomy in 2011 without bleeding. She has 2 sisters who have heavy menses.  Recently, with her pregnancy she has had 2 months of spotting up. She states that prior to admission she had some lightheadedness and fatigue. She states that she would sometimes "pass out as if she had a hangover".  Review of labs dating back to 2015 revealed a normal CBC. CBC on admission included a hematocrit of 40.1, hemoglobin 13.5, platelets 170,000, and white count 10,000.  The patient underwent dilation and evacuation by Dr. Velora Mediateobert Harris on 01/23/2014 for a missed abortion and possible molar pregnancy. Estimated blood loss was 2000 ml.  Follow-up hematocrit post procedure was 15.5 with a hemoglobin of 5.1.  Iron studies were normal with a ferritin of 73, iron saturation of 25% and TIBC 200.  She states that she feels dizzy when she gets up.  She is unable to walk to the bathroom unless she holds onto the wall. She has had some vaginal bleeding since her procedure (improving).  Regarding her diet, she states that it is good. Over the past month, she has not eaten meat because of nausea associated with her pregnancy. She has eaten mostly fruit and ice.  She craves smoothies and ice cream. She notes that over the past 2 years she has lost 100  pounds secondary to stress.   She has declined transfusion secondary to being uncomfortable receiving a transfusion.  Although not a Jehovah's Witness, she grew up in this religion.   Past Medical History:  Diagnosis Date  . Asthma   . Diabetes mellitus without complication (HCC)   . Dyspnea   . Hypertension   . Seizures (HCC)    as a child    Past Surgical History:  Procedure Laterality Date  . DILATION AND EVACUATION N/A 01/24/2016   Procedure: DILATATION AND EVACUATION;  Surgeon: Nadara Mustardobert P Harris, MD;  Location: [facility] ORS;  Service: Gynecology;  Laterality: N/A;  . TONSILLECTOMY      History reviewed. No pertinent family history.  Social History:  reports that she quit smoking about 5 weeks ago. She has never used smokeless tobacco. She reports that she does not drink alcohol or use drugs. Although not a Jehovah's Witness, she grew up in this religion.  She is originally [place]  She is accompanied by her mother   Allergies:  Allergies  Allergen Reactions  . Bee Venom Swelling  . Shellfish Allergy Anaphylaxis    Betadine OK    No prescriptions prior to admission.    Review of Systems: GENERAL:  Feels tired/fatigued.  No fevers or sweats.  Weight loss of 100 pounds in the past 2 years. PERFORMANCE STATUS (ECOG):  1 HEENT:  No visual changes, runny nose, sore throat, mouth sores or tenderness. Lungs: Shortness of breath.  No cough.  No  hemoptysis. Cardiac:  No chest pain, palpitations, orthopnea, or PND. GI:  Nausea, vomiting, and diarrhea prior to admission.  No constipation, melena or hematochezia. GU:  Vaginal bleeding, improving post D&C.  No urgency, frequency, dysuria, or hematuria. Musculoskeletal:  No back pain.  No joint pain.  No muscle tenderness. Extremities:  No pain or swelling. Skin:  No rashes or skin changes. Neuro:  Headache.  No umbness or weakness, balance or coordination issues. Endocrine:  No diabetes, thyroid issues, hot flashes or night  sweats. Psych:  Stress.  No mood changes, depression or anxiety. Pain:  No focal pain. Review of systems:  All other systems reviewed and found to be negative.  Physical Exam:  Blood pressure 128/70, pulse 74, temperature 98.3 F (36.8 C), temperature source Oral, resp. rate 20, height 5\' 3"  (1.6 m), weight 278 lb (126.1 kg), last menstrual period 11/11/2015, SpO2 100 %.  GENERAL:  Well developed, well nourished, heavyset woman sitting comfortably on the medical unit in no acute distress. MENTAL STATUS:  Alert and oriented to person, place and time. HEAD:  Brown hair pulled back..  Normocephalic, atraumatic, face symmetric, no Cushingoid features. EYES:  Brown eyes.  Pupils equal round and reactive to light and accomodation.  No conjunctivitis or scleral icterus. ENT:  Oropharynx clear without lesion.  Tongue normal. Mucous membranes moist.  RESPIRATORY:  Clear to auscultation without rales, wheezes or rhonchi. CARDIOVASCULAR:  Regular rate and rhythm without murmur, rub or gallop. ABDOMEN:  Soft, non-tender, with active bowel sounds, and no hepatosplenomegaly.  No masses. SKIN:  No rashes, ulcers or lesions. EXTREMITIES: ICDs in place.  No edema, no skin discoloration or tenderness.  No palpable cords. LYMPH NODES: No palpable cervical, supraclavicular, axillary or inguinal adenopathy  NEUROLOGICAL: Unremarkable. PSYCH:  Appropriate.  Results for orders placed or performed during the hospital encounter of 01/24/16 (from the past 48 hour(s))  CBC     Status: None   Collection Time: 01/24/16  9:15 AM  Result Value Ref Range   WBC 10.0 3.6 - 11.0 K/uL   RBC 4.43 3.80 - 5.20 MIL/uL   Hemoglobin 13.5 12.0 - 16.0 g/dL   HCT 16.1 09.6 - 04.5 %   MCV 90.3 80.0 - 100.0 fL   MCH 30.5 26.0 - 34.0 pg   MCHC 33.8 32.0 - 36.0 g/dL   RDW 40.9 81.1 - 91.4 %   Platelets 170 150 - 440 K/uL  Glucose, capillary     Status: None   Collection Time: 01/24/16 10:39 AM  Result Value Ref Range    Glucose-Capillary 73 65 - 99 mg/dL  Type and screen [facility]     Status: None   Collection Time: 01/24/16 10:49 AM  Result Value Ref Range   ABO/RH(D) A POS    Antibody Screen NEG    Sample Expiration 01/27/2016   Hemoglobin and hematocrit, blood     Status: Abnormal   Collection Time: 01/24/16 11:40 AM  Result Value Ref Range   Hemoglobin 5.1 (L) 12.0 - 16.0 g/dL    Comment: RESULT REPEATED AND VERIFIED   HCT 15.5 (L) 35.0 - 47.0 %  Glucose, capillary     Status: Abnormal   Collection Time: 01/24/16 12:57 PM  Result Value Ref Range   Glucose-Capillary 104 (H) 65 - 99 mg/dL  Ferritin     Status: None   Collection Time: 01/24/16  4:32 PM  Result Value Ref Range   Ferritin 73 11 - 307 ng/mL  Iron  and TIBC     Status: Abnormal   Collection Time: 01/24/16  4:32 PM  Result Value Ref Range   Iron 49 28 - 170 ug/dL   TIBC 782 (L) 956 - 213 ug/dL   Saturation Ratios 25 10.4 - 31.8 %   UIBC 151 ug/dL  Glucose, capillary     Status: Abnormal   Collection Time: 01/24/16  7:24 PM  Result Value Ref Range   Glucose-Capillary 104 (H) 65 - 99 mg/dL   Comment 1 Notify RN    No results found.  Assessment:  The patient is a 25 y.o. woman with significant blood loss secondary to dilatation and evacuation for molar pregnancy/loss.  Hematocrit   prior to procedure was 40.1 and post procedure was 15.5.  She notes some light headedness prior to her admission which has worsened since her blood loss.  She is declining transfusion secondary to concerns about blood transfusions given her past experience growing up as a Jehovah's Witness.  She has a history of heavy menses. She has had some mild bruising in the past as well as epistaxis.  She had a tonsillectomy in 2011 without bleeding. She has 2 sisters with heavy menses. There is no known family history of a bleeding diathesis.  Plan:   1.  Review current and prior labs with patient and her mother. Discuss her thoughts about  transfusion.  Discus checking for any deficiencies (iron, vitamins, erythropoietin) so that we may corrected any abnormalities. Labs note no iron deficiency.  Reviewed that it will take several days for her to make adequate red blood cells so that she is not symptomatic.  Typical RBC cutoff for transfusion is a hemoglobin of 7-8.  She is more likely to be symptomatic secondary to the rapid blood loss rather than getting to this level slowly over time.  She stated that she would think about it and decide if she would consider transfusion in the morning based on her labs.  Multiple questions were asked and answered.  2.  Active type and screen. 3.  Labs:  CBC with diff in AM, B12, folate, erythropoietin level, retic, von Willebrand panel, PT, PTT.  Thank you for allowing me to participate in Helem Reesor 's care.  I will follow her closely with you while hospitalized and after discharge in the outpatient department.   Rosey Bath, MD  01/24/2016

## 2016-01-25 NOTE — Discharge Summary (Signed)
Patient discharged home, prescriptions and  discharge instructions given and reviewed, patient states understanding. Patient left floor in stable condition, denies any other needs at this time. Patient appointment scheduled.

## 2016-01-25 NOTE — Discharge Summary (Signed)
Physician Discharge Summary  Patient ID: Carla Nichols MRN: [id number] DOB/AGE: 1990/07/14 25 y.o.  Admit date: 01/24/2016 Discharge date: 01/25/2016  Admission Diagnoses: Missed abortion concern for molar pregnancy  Discharge Diagnoses:  Principal Problem:   Abortion, missed Active Problems:   Anemia   Discharged Condition: good  Hospital Course: Patient admitted for management of missed abortion, concern for molar pregnancy given only debri seen in uterus, rising HCG, development of mild hypertension and 11lbs weight gain in 1 week.  Patient underwent D&C with significant EBL also consistent with molar pregnancy.  She declined pRBC secondary to religious believes, however, POD 1 Hgb was actually above transfusion threshold and she remained afebrile and hemodynamically stable.  Final pathology pending but we discussed concern for molar pregnancy, she is interested in a Mirena, and is aware of importance of following up on HCG trends.  Minimal to no bleeding at time of discharge  Consults: Medicine and hematology  Significant Diagnostic Studies: Results for orders placed or performed during the hospital encounter of 01/24/16 (from the past 48 hour(s))  CBC     Status: None   Collection Time: 01/24/16  9:15 AM  Result Value Ref Range   WBC 10.0 3.6 - 11.0 K/uL   RBC 4.43 3.80 - 5.20 MIL/uL   Hemoglobin 13.5 12.0 - 16.0 g/dL   HCT 40.940.1 81.135.0 - 91.447.0 %   MCV 90.3 80.0 - 100.0 fL   MCH 30.5 26.0 - 34.0 pg   MCHC 33.8 32.0 - 36.0 g/dL   RDW 78.213.4 95.611.5 - 21.314.5 %   Platelets 170 150 - 440 K/uL  Glucose, capillary     Status: None   Collection Time: 01/24/16 10:39 AM  Result Value Ref Range   Glucose-Capillary 73 65 - 99 mg/dL  Type and screen [facility]     Status: None   Collection Time: 01/24/16 10:49 AM  Result Value Ref Range   ABO/RH(D) A POS    Antibody Screen NEG    Sample Expiration 01/27/2016   Hemoglobin and hematocrit, blood     Status: Abnormal    Collection Time: 01/24/16 11:40 AM  Result Value Ref Range   Hemoglobin 5.1 (L) 12.0 - 16.0 g/dL    Comment: RESULT REPEATED AND VERIFIED   HCT 15.5 (L) 35.0 - 47.0 %  Glucose, capillary     Status: Abnormal   Collection Time: 01/24/16 12:57 PM  Result Value Ref Range   Glucose-Capillary 104 (H) 65 - 99 mg/dL  Ferritin     Status: None   Collection Time: 01/24/16  4:32 PM  Result Value Ref Range   Ferritin 73 11 - 307 ng/mL  Iron and TIBC     Status: Abnormal   Collection Time: 01/24/16  4:32 PM  Result Value Ref Range   Iron 49 28 - 170 ug/dL   TIBC 086200 (L) 578250 - 469450 ug/dL   Saturation Ratios 25 10.4 - 31.8 %   UIBC 151 ug/dL  Glucose, capillary     Status: Abnormal   Collection Time: 01/24/16  7:24 PM  Result Value Ref Range   Glucose-Capillary 104 (H) 65 - 99 mg/dL   Comment 1 Notify RN   CBC with Differential/Platelet     Status: Abnormal   Collection Time: 01/25/16  4:55 AM  Result Value Ref Range   WBC 13.0 (H) 3.6 - 11.0 K/uL   RBC 2.44 (L) 3.80 - 5.20 MIL/uL   Hemoglobin 7.4 (L) 12.0 - 16.0 g/dL  Comment: RESULT REPEATED AND VERIFIED   HCT 21.9 (L) 35.0 - 47.0 %   MCV 90.0 80.0 - 100.0 fL   MCH 30.2 26.0 - 34.0 pg   MCHC 33.5 32.0 - 36.0 g/dL   RDW 16.1 09.6 - 04.5 %   Platelets 122 (L) 150 - 440 K/uL   Neutrophils Relative % 75 %   Neutro Abs 9.8 (H) 1.4 - 6.5 K/uL   Lymphocytes Relative 18 %   Lymphs Abs 2.3 1.0 - 3.6 K/uL   Monocytes Relative 6 %   Monocytes Absolute 0.8 0.2 - 0.9 K/uL   Eosinophils Relative 0 %   Eosinophils Absolute 0.0 0 - 0.7 K/uL   Basophils Relative 1 %   Basophils Absolute 0.1 0 - 0.1 K/uL  Folate     Status: None   Collection Time: 01/25/16  4:55 AM  Result Value Ref Range   Folate 27.0 >5.9 ng/mL  Reticulocytes     Status: Abnormal   Collection Time: 01/25/16  4:55 AM  Result Value Ref Range   Retic Ct Pct 2.3 0.4 - 3.1 %   RBC. 2.44 (L) 3.80 - 5.20 MIL/uL   Retic Count, Manual 56.1 19.0 - 183.0 K/uL  APTT     Status:  None   Collection Time: 01/25/16  4:55 AM  Result Value Ref Range   aPTT 28 24 - 36 seconds  Protime-INR     Status: Abnormal   Collection Time: 01/25/16  4:55 AM  Result Value Ref Range   Prothrombin Time 15.5 (H) 11.4 - 15.2 seconds   INR 1.22    Treatments: IV hydration and surgery: D&C  Discharge Exam: Blood pressure (!) 127/59, pulse 80, temperature 99.5 F (37.5 C), temperature source Oral, resp. rate 18, height 5\' 3"  (1.6 m), weight 278 lb (126.1 kg), last menstrual period 11/11/2015, SpO2 100 %. General appearance: alert, appears stated age and no distress GI: soft, non-tender; bowel sounds normal; no masses,  no organomegaly  Disposition: 01-Home or Self Care  Discharge Instructions    Activity as tolerated    Complete by:  As directed    Call MD for:  difficulty breathing, headache or visual disturbances    Complete by:  As directed    Call MD for:  extreme fatigue    Complete by:  As directed    Call MD for:  hives    Complete by:  As directed    Call MD for:  persistant dizziness or light-headedness    Complete by:  As directed    Call MD for:  persistant nausea and vomiting    Complete by:  As directed    Call MD for:  severe uncontrolled pain    Complete by:  As directed    Call MD for:  temperature >100.4    Complete by:  As directed    Diet - low sodium heart healthy    Complete by:  As directed    Sexual acrtivity    Complete by:  As directed    No intercourse for 6 weeks       Medication List    TAKE these medications   doxycycline 100 MG capsule Commonly known as:  VIBRAMYCIN Take 1 capsule (100 mg total) by mouth 2 (two) times daily.   ferrous fumarate-b12-vitamic C-folic acid capsule Commonly known as:  TRINSICON / FOLTRIN Take 1 capsule by mouth 3 (three) times daily after meals.   ibuprofen 600 MG tablet Commonly known as:  ADVIL,MOTRIN Take 1 tablet (600 mg total) by mouth every 6 (six) hours as needed for mild pain or cramping.    methylergonovine 0.2 MG tablet Commonly known as:  METHERGINE Take 1 tablet (0.2 mg total) by mouth every 8 (eight) hours. For SIX doses to minimize bleeding   metroNIDAZOLE 500 MG tablet Commonly known as:  FLAGYL Take 1 tablet (500 mg total) by mouth 2 (two) times daily.      Follow-up Information    Letitia Libra, MD Follow up in 1 week(s).   Specialty:  Obstetrics and Gynecology Contact information: [address] [place] [address] [phone]           Signed: Lorrene Reid 01/25/2016, 10:49 AM

## 2016-01-26 LAB — VON WILLEBRAND PANEL
Coagulation Factor VIII: 170 % — ABNORMAL HIGH (ref 57–163)
Ristocetin Co-factor, Plasma: 178 % (ref 50–200)
Von Willebrand Antigen, Plasma: 138 % (ref 50–200)

## 2016-01-26 LAB — COAG STUDIES INTERP REPORT: PDF Image: 0

## 2016-01-26 LAB — ERYTHROPOIETIN: Erythropoietin: 33.1 m[IU]/mL — ABNORMAL HIGH (ref 2.6–18.5)

## 2016-01-29 LAB — SURGICAL PATHOLOGY

## 2016-01-30 NOTE — Anesthesia Postprocedure Evaluation (Signed)
Anesthesia Post Note  Patient: Carla Nichols  Procedure(s) Performed: Procedure(s) (LRB): DILATATION AND EVACUATION (N/A)  Patient location during evaluation: PACU Anesthesia Type: General Level of consciousness: awake Pain management: pain level controlled Vital Signs Assessment: post-procedure vital signs reviewed and stable Respiratory status: spontaneous breathing Cardiovascular status: stable Anesthetic complications: no    Last Vitals:  Vitals:   01/25/16 0848 01/25/16 1211  BP: (!) 127/59 (!) 123/49  Pulse: 80 85  Resp: 18 18  Temp: 37.5 C 37.2 C    Last Pain:  Vitals:   01/25/16 1211  TempSrc: Oral  PainSc:                  VAN STAVEREN,Raza Bayless

## 2016-02-08 ENCOUNTER — Emergency Department
Admission: EM | Admit: 2016-02-08 | Discharge: 2016-02-08 | Disposition: A | Discharge status: Home / Self Care | Payer: Medicaid Other | Attending: Emergency Medicine | Admitting: Emergency Medicine

## 2016-02-08 ENCOUNTER — Emergency Department: Payer: Medicaid Other

## 2016-02-08 DIAGNOSIS — I10 Essential (primary) hypertension: Secondary | ICD-10-CM | POA: Diagnosis not present

## 2016-02-08 DIAGNOSIS — Z87891 Personal history of nicotine dependence: Secondary | ICD-10-CM | POA: Insufficient documentation

## 2016-02-08 DIAGNOSIS — J209 Acute bronchitis, unspecified: Secondary | ICD-10-CM | POA: Diagnosis not present

## 2016-02-08 DIAGNOSIS — E119 Type 2 diabetes mellitus without complications: Secondary | ICD-10-CM | POA: Diagnosis not present

## 2016-02-08 DIAGNOSIS — R0789 Other chest pain: Secondary | ICD-10-CM | POA: Diagnosis present

## 2016-02-08 DIAGNOSIS — J45909 Unspecified asthma, uncomplicated: Secondary | ICD-10-CM | POA: Diagnosis not present

## 2016-02-08 DIAGNOSIS — Z79899 Other long term (current) drug therapy: Secondary | ICD-10-CM | POA: Diagnosis not present

## 2016-02-08 LAB — COMPREHENSIVE METABOLIC PANEL
ALBUMIN: 4 g/dL (ref 3.5–5.0)
ALK PHOS: 49 U/L (ref 38–126)
ALT: 19 U/L (ref 14–54)
AST: 18 U/L (ref 15–41)
Anion gap: 7 (ref 5–15)
BUN: 11 mg/dL (ref 6–20)
CALCIUM: 8.9 mg/dL (ref 8.9–10.3)
CHLORIDE: 105 mmol/L (ref 101–111)
CO2: 25 mmol/L (ref 22–32)
CREATININE: 0.6 mg/dL (ref 0.44–1.00)
GFR calc Af Amer: >60 mL/min (ref >60)
GFR calc non Af Amer: >60 mL/min (ref >60)
GLUCOSE: 91 mg/dL (ref 65–99)
Potassium: 3.5 mmol/L (ref 3.5–5.1)
SODIUM: 137 mmol/L (ref 135–145)
Total Bilirubin: 0.3 mg/dL (ref 0.3–1.2)
Total Protein: 7.6 g/dL (ref 6.5–8.1)

## 2016-02-08 LAB — CBC
HEMATOCRIT: 31.4 % — AB (ref 35.0–47.0)
Hemoglobin: 10.4 g/dL — ABNORMAL LOW (ref 12.0–16.0)
MCH: 31 pg (ref 26.0–34.0)
MCHC: 33.1 g/dL (ref 32.0–36.0)
MCV: 93.7 fL (ref 80.0–100.0)
PLATELETS: 275 10*3/uL (ref 150–440)
RBC: 3.35 MIL/uL — ABNORMAL LOW (ref 3.80–5.20)
RDW: 15.2 % — AB (ref 11.5–14.5)
WBC: 7.1 10*3/uL (ref 3.6–11.0)

## 2016-02-08 LAB — TROPONIN I: Troponin I: <0.03 ng/mL (ref <0.03)

## 2016-02-08 LAB — FIBRIN DERIVATIVES D-DIMER (ARMC ONLY): Fibrin derivatives D-dimer (ARMC): 339 (ref 0–499)

## 2016-02-08 MED ORDER — IPRATROPIUM-ALBUTEROL 0.5-2.5 (3) MG/3ML IN SOLN
3.0000 mL | Freq: Once | RESPIRATORY_TRACT | Status: AC
  Administered 2016-02-08: 3 mL via RESPIRATORY_TRACT

## 2016-02-08 MED ORDER — HYDROCOD POLST-CPM POLST ER 10-8 MG/5ML PO SUER: 5.0000 mL | Freq: Two times a day (BID) | ORAL | 0 refills | Status: DC | PRN

## 2016-02-08 MED ORDER — AZITHROMYCIN 500 MG PO TABS: 500.0000 mg | ORAL_TABLET | Freq: Every day | ORAL | 0 refills | Status: AC

## 2016-02-08 MED ORDER — HYDROCOD POLST-CPM POLST ER 10-8 MG/5ML PO SUER
5.0000 mL | Freq: Once | ORAL | Status: AC
  Administered 2016-02-08: 5 mL via ORAL
  Filled 2016-02-08: qty 5

## 2016-02-08 MED ORDER — AZITHROMYCIN 500 MG PO TABS
500.0000 mg | ORAL_TABLET | Freq: Once | ORAL | Status: AC
  Administered 2016-02-08: 500 mg via ORAL
  Filled 2016-02-08: qty 1

## 2016-02-08 MED ORDER — IPRATROPIUM-ALBUTEROL 0.5-2.5 (3) MG/3ML IN SOLN
RESPIRATORY_TRACT | Status: AC
  Administered 2016-02-08: 3 mL via RESPIRATORY_TRACT
  Filled 2016-02-08: qty 3

## 2016-02-08 NOTE — ED Provider Notes (Signed)
[facility] Emergency Department Provider Note    First MD Initiated Contact with Patient 02/08/16 [phone]     (approximate)  I have reviewed the triage vital signs and the nursing notes.   HISTORY  Chief Complaint Chest Pain   HPI Carla Nichols is a 25 y.o. female presents with cough congestion and chest discomfort with coughing.   Past Medical History:  Diagnosis Date  . Asthma   . Diabetes mellitus without complication (HCC)   . Dyspnea   . Hypertension   . Seizures (HCC)    as a child    Patient Active Problem List   Diagnosis Date Noted  . Abortion, missed 01/24/2016  . Anemia 01/24/2016    Past Surgical History:  Procedure Laterality Date  . DILATION AND EVACUATION N/A 01/24/2016   Procedure: DILATATION AND EVACUATION;  Surgeon: Nadara Mustard, MD;  Location: [facility] ORS;  Service: Gynecology;  Laterality: N/A;  . TONSILLECTOMY      Prior to Admission medications   Medication Sig Start Date End Date Taking? Authorizing Provider  doxycycline (VIBRAMYCIN) 100 MG capsule Take 1 capsule (100 mg total) by mouth 2 (two) times daily. 01/24/16   Nadara Mustard, MD  ferrous fumarate-b12-vitamic C-folic acid (TRINSICON / FOLTRIN) capsule Take 1 capsule by mouth 3 (three) times daily after meals. 01/25/16   Vena Austria, MD  ibuprofen (ADVIL,MOTRIN) 600 MG tablet Take 1 tablet (600 mg total) by mouth every 6 (six) hours as needed for mild pain or cramping. 01/25/16   Vena Austria, MD    Allergies Bee venom and Shellfish allergy  No family history on file.  Social History Social History  Substance Use Topics  . Smoking status: Former Smoker    Quit date: 12/21/2015  . Smokeless tobacco: Never Used  . Alcohol use No    Review of Systems Constitutional: No fever/chills Eyes: No visual changes. ENT: No sore throat. Cardiovascular: Denies chest pain. Respiratory: Denies shortness of breath. Gastrointestinal: No abdominal pain.   No nausea, no vomiting.  No diarrhea.  No constipation. Genitourinary: Negative for dysuria. Musculoskeletal: Negative for back pain. Skin: Negative for rash. Neurological: Negative for headaches, focal weakness or numbness.  10-point ROS otherwise negative.  ____________________________________________   PHYSICAL EXAM:  VITAL SIGNS: ED Triage Vitals  Enc Vitals Group     BP 02/08/16 0555 137/90     Pulse Rate 02/08/16 0555 (!) 105     Resp 02/08/16 0555 (!) 23     Temp 02/08/16 0555 98.3 F (36.8 C)     Temp Source 02/08/16 0555 Oral     SpO2 02/08/16 0555 99 %     Weight 02/08/16 0552 278 lb (126.1 kg)     Height 02/08/16 0552 5\' 3"  (1.6 m)     Head Circumference --      Peak Flow --      Pain Score 02/08/16 0552 10     Pain Loc --      Pain Edu? --      Excl. in GC? --     Constitutional: Alert and oriented. Well appearing and in no acute distress. Eyes: Conjunctivae are normal. PERRL. EOMI. Head: Atraumatic. Ears:  Healthy appearing ear canals and TMs bilaterally Nose: No congestion/rhinnorhea. Mouth/Throat: Mucous membranes are moist.  Oropharynx non-erythematous. Neck: No stridor.  No meningeal signs.   Cardiovascular: Normal rate, regular rhythm. Good peripheral circulation. Grossly normal heart sounds. Respiratory: Normal respiratory effort.  No retractions.Mild expiratory wheezes Gastrointestinal:  Soft and nontender. No distention.  Musculoskeletal: No lower extremity tenderness nor edema. No gross deformities of extremities. Neurologic:  Normal speech and language. No gross focal neurologic deficits are appreciated.  Skin:  Skin is warm, dry and intact. No rash noted. Psychiatric: Mood and affect are normal. Speech and behavior are normal.  ____________________________________________   LABS (all labs ordered are listed, but only abnormal results are displayed)  Labs Reviewed  CBC - Abnormal; Notable for the following:       Result Value   RBC 3.35  (*)    Hemoglobin 10.4 (*)    HCT 31.4 (*)    RDW 15.2 (*)    All other components within normal limits  COMPREHENSIVE METABOLIC PANEL  TROPONIN I  FIBRIN DERIVATIVES D-DIMER ([facility] ONLY)   ____________________________________________  EKG  ED ECG REPORT I, Pope N BROWN, the attending physician, personally viewed and interpreted this ECG.   Date: 02/08/2016  EKG Time: 5:57 AM   Rate: 92  Rhythm: Normal sinus rhythm  Axis: Normal  Intervals: Normal  ST&T Change: None  ____________________________________________  RADIOLOGY I, [place] N BROWN, personally viewed and evaluated these images (plain radiographs) as part of my medical decision making, as well as reviewing the written report by the radiologist.  No results found.   Procedures     INITIAL IMPRESSION / ASSESSMENT AND PLAN / ED COURSE  Pertinent labs & imaging results that were available during my care of the patient were reviewed by me and considered in my medical decision making (see chart for details).     Clinical Course    ____________________________________________  FINAL CLINICAL IMPRESSION(S) / ED DIAGNOSES  Final diagnoses:  Acute bronchitis, unspecified organism     MEDICATIONS GIVEN DURING THIS VISIT:  Medications  chlorpheniramine-HYDROcodone (TUSSIONEX) 10-8 MG/5ML suspension 5 mL (not administered)  azithromycin (ZITHROMAX) tablet 500 mg (not administered)  ipratropium-albuterol (DUONEB) 0.5-2.5 (3) MG/3ML nebulizer solution 3 mL (3 mLs Nebulization Given 02/08/16 0616)     NEW OUTPATIENT MEDICATIONS STARTED DURING THIS VISIT:  New Prescriptions   No medications on file    Modified Medications   No medications on file    Discontinued Medications   No medications on file     Note:  This document was prepared using Dragon voice recognition software and may include unintentional dictation errors.    Darci Currentandolph N Brown, MD 02/09/16 [phone]

## 2016-02-08 NOTE — ED Triage Notes (Signed)
Patient ambulatory to triage with steady gait, without difficulty or distress noted; pt reports D&C on 10/5; mid CP [facility]; denies hx of same

## 2016-02-08 NOTE — ED Notes (Signed)
Pt had recent d&c for molar pregnancy.  Pt reports that she has not had a period since July.

## 2016-04-21 NOTE — L&D Delivery Note (Signed)
Patient is a 26 y.o. now G2P1011 s/p NSVD at 1825w1d, who was admitted for IOL due to A1GDM and cHTN.  Delivery Note At 1:00 AM a viable female was delivered via Vaginal, Spontaneous (Presentation: LOA).  APGAR: 8, 9; weight pending.   Placenta status: intact. 3-vessel Cord:  with the following complications: pending.  Anesthesia: Epidural  Episiotomy: None Lacerations: 1st degree; right perineal sulcus Suture Repair: 2.0 vicryl Est. Blood Loss (mL): 200  Mom to postpartum.  Baby to Couplet care / Skin to Skin.  Carla Nichols 03/05/2017, 2:13 AM     Head delivered LOA. No nuchal cord present. Shoulder and body delivered in usual fashion. Infant with spontaneous cry, placed on mother's abdomen, dried and bulb suctioned. Cord clamped x 2 after 1-minute delay, and cut by family member. Cord blood drawn. Placenta delivered spontaneously with gentle cord traction. Fundus firm with massage and Pitocin. Perineum inspected and found to have 1st degree laceration, which was repaired with 2-0 with good hemostasis achieved.  Jules Schickim Margareth Kanner, DO PGY-1, [facility] 03/05/17, 2:13 AM

## 2016-07-31 ENCOUNTER — Encounter: Payer: Self-pay | Admitting: Advanced Practice Midwife

## 2016-07-31 LAB — OB RESULTS CONSOLE RUBELLA ANTIBODY, IGM: RUBELLA: IMMUNE

## 2016-07-31 LAB — HEMOGLOBIN A1C
HEMOGLOBIN A1C: 4.9
URINE CULTURE, OB: NEGATIVE

## 2016-07-31 LAB — OB RESULTS CONSOLE HIV ANTIBODY (ROUTINE TESTING): HIV: NONREACTIVE

## 2016-07-31 LAB — OB RESULTS CONSOLE PLATELET COUNT: Platelets: 220

## 2016-07-31 LAB — OB RESULTS CONSOLE ABO/RH: RH TYPE: POSITIVE

## 2016-07-31 LAB — OB RESULTS CONSOLE HGB/HCT, BLOOD
HEMATOCRIT: 40
HEMOGLOBIN: 12.8

## 2016-07-31 LAB — OB RESULTS CONSOLE GC/CHLAMYDIA
Chlamydia: NEGATIVE
GC PROBE AMP, GENITAL: NEGATIVE

## 2016-07-31 LAB — OB RESULTS CONSOLE HEPATITIS B SURFACE ANTIGEN: Hepatitis B Surface Ag: NEGATIVE

## 2016-07-31 LAB — OB RESULTS CONSOLE RPR: RPR: NONREACTIVE

## 2016-10-14 ENCOUNTER — Emergency Department
Admission: EM | Admit: 2016-10-14 | Discharge: 2016-10-14 | Discharge status: Against Medical Advice | Payer: Medicaid Other | Attending: Emergency Medicine | Admitting: Emergency Medicine

## 2016-10-14 ENCOUNTER — Encounter: Payer: Self-pay | Admitting: Emergency Medicine

## 2016-10-14 DIAGNOSIS — N939 Abnormal uterine and vaginal bleeding, unspecified: Secondary | ICD-10-CM | POA: Diagnosis present

## 2016-10-14 DIAGNOSIS — Z3A18 18 weeks gestation of pregnancy: Secondary | ICD-10-CM | POA: Diagnosis not present

## 2016-10-14 LAB — ABO/RH: ABO/RH(D): A POS

## 2016-10-14 LAB — HCG, QUANTITATIVE, PREGNANCY: HCG, BETA CHAIN, QUANT, S: 9535 m[IU]/mL — AB (ref <5)

## 2016-10-14 NOTE — ED Triage Notes (Signed)
Pt is [redacted] weeks pregnant and started with vaginal bleeding after sex.  G2A1.  Had miscarriage with first pregnancy.  Has only seen blood when wipes. NAD.

## 2016-10-14 NOTE — ED Notes (Signed)
First Nurse: pt presents with being [redacted] weeks gestation with vaginal bleeding and cramping this am. Pt states her due date is 03/11/2017. This is pts second pregnancy with the first being a miscarriage.

## 2016-10-16 ENCOUNTER — Telehealth: Payer: Self-pay | Admitting: Emergency Medicine

## 2016-10-16 NOTE — Telephone Encounter (Signed)
Called patient due to lwot to inquire about condition and follow up plans. Pt says she has already contacted her ob and has appt.  I told her to return here if needed and to make sure her ob looks at the labs we did here.

## 2016-10-31 ENCOUNTER — Emergency Department (HOSPITAL_COMMUNITY)
Admission: EM | Admit: 2016-10-31 | Discharge: 2016-10-31 | Disposition: A | Discharge status: Home / Self Care | Payer: Medicaid Other | Attending: Emergency Medicine | Admitting: Emergency Medicine

## 2016-10-31 ENCOUNTER — Encounter (HOSPITAL_COMMUNITY): Payer: Self-pay | Admitting: Emergency Medicine

## 2016-10-31 DIAGNOSIS — J4541 Moderate persistent asthma with (acute) exacerbation: Secondary | ICD-10-CM | POA: Insufficient documentation

## 2016-10-31 DIAGNOSIS — Z79899 Other long term (current) drug therapy: Secondary | ICD-10-CM | POA: Insufficient documentation

## 2016-10-31 DIAGNOSIS — R0602 Shortness of breath: Secondary | ICD-10-CM | POA: Diagnosis present

## 2016-10-31 DIAGNOSIS — E119 Type 2 diabetes mellitus without complications: Secondary | ICD-10-CM | POA: Diagnosis not present

## 2016-10-31 DIAGNOSIS — Z7982 Long term (current) use of aspirin: Secondary | ICD-10-CM | POA: Diagnosis not present

## 2016-10-31 DIAGNOSIS — Z87891 Personal history of nicotine dependence: Secondary | ICD-10-CM | POA: Diagnosis not present

## 2016-10-31 MED ORDER — ALBUTEROL SULFATE (2.5 MG/3ML) 0.083% IN NEBU: 2.5000 mg | INHALATION_SOLUTION | RESPIRATORY_TRACT | 0 refills | Status: DC | PRN

## 2016-10-31 MED ORDER — IPRATROPIUM BROMIDE 0.02 % IN SOLN
0.5000 mg | Freq: Once | RESPIRATORY_TRACT | Status: AC
  Administered 2016-10-31: 0.5 mg via RESPIRATORY_TRACT
  Filled 2016-10-31: qty 2.5

## 2016-10-31 MED ORDER — ALBUTEROL SULFATE (2.5 MG/3ML) 0.083% IN NEBU
5.0000 mg | INHALATION_SOLUTION | Freq: Once | RESPIRATORY_TRACT | Status: AC
  Administered 2016-10-31: 5 mg via RESPIRATORY_TRACT
  Filled 2016-10-31: qty 6

## 2016-10-31 MED ORDER — PREDNISONE 20 MG PO TABS: ORAL_TABLET | ORAL | 0 refills | Status: DC

## 2016-10-31 MED ORDER — PREDNISONE 20 MG PO TABS
60.0000 mg | ORAL_TABLET | Freq: Once | ORAL | Status: AC
  Administered 2016-10-31: 60 mg via ORAL
  Filled 2016-10-31: qty 3

## 2016-10-31 NOTE — ED Notes (Signed)
Pt is pregnant

## 2016-10-31 NOTE — ED Triage Notes (Signed)
Pt reports SOB starting last night with intermittent CP. Labored breathing noted during triage. Hx asthma

## 2016-10-31 NOTE — Progress Notes (Signed)
RROB called to patient's bedside who [facility] with complaints of shortness of breath and cough x1 day; patient is a G2P0 who is 22 and 3/[redacted] weeks along in her pregnancy at this time; [place] [facility] [place] [facility] [place]; Doppler applied and FHR 150s; patient denies any pregnancy concerns at this time; patient OB cleared at this time

## 2016-10-31 NOTE — ED Provider Notes (Signed)
[facility]-EMER[facility]ENCY DEPT Provider Note   CSN: [id number] Arrival date & time: 10/31/16  0143     History   Chief Complaint Chief Complaint  Patient presents with  . Shortness of Breath    HPI Carla Nichols is a 26 y.o. female with a hx of asthma, diabetes, HTN, seizures presents to the Emergency Department complaining of gradual, persistent, progressively worsening SOB with associated wheezing onset 2 days ago.  Pt reports associated nasal congestion and cough that began around the same time.  She reports using her albuterol MDI without relief.  Nothing seems to make the symptoms worse.  Pt is [redacted] weeks pregnant and [facility].  Pt denies fever, Chills, headache, neck pain, chest pain, abdominal pain, vaginal bleeding, dysuria, hematuria..     The history is provided by the patient and medical records. No language interpreter was used.    Past Medical History:  Diagnosis Date  . Asthma   . Diabetes mellitus without complication (HCC)   . Dyspnea   . Hypertension   . Seizures (HCC)    as a child    Patient Active Problem List   Diagnosis Date Noted  . Abortion, missed 01/24/2016  . Anemia 01/24/2016    Past Surgical History:  Procedure Laterality Date  . DILATION AND EVACUATION N/A 01/24/2016   Procedure: DILATATION AND EVACUATION;  Surgeon: Nadara Mustardobert P Harris, MD;  Location: [facility] ORS;  Service: Gynecology;  Laterality: N/A;  . TONSILLECTOMY      OB History    Gravida Para Term Preterm AB Living   2             SAB TAB Ectopic Multiple Live Births                   Home Medications    Prior to Admission medications   Medication Sig Start Date End Date Taking? Authorizing Provider  albuterol (PROVENTIL HFA;VENTOLIN HFA) 108 (90 Base) MCG/ACT inhaler Inhale 2 puffs into the lungs every 6 (six) hours as needed.   Yes [provider]  amLODipine (NORVASC) 5 MG tablet Take 5 mg by mouth daily.   Yes [provider]  aspirin EC 81 MG  tablet Take 81 mg by mouth daily.   Yes [provider]  ferrous fumarate-b12-vitamic C-folic acid (TRINSICON / FOLTRIN) capsule Take 1 capsule by mouth 3 (three) times daily after meals. 01/25/16  Yes Vena AustriaStaebler, Andreas, MD  chlorpheniramine-HYDROcodone [facility](TUSSIONEX PENNKINETIC ER) 10-8 MG/5ML SUER Take 5 mLs by mouth every 12 (twelve) hours as needed for cough. Patient not taking: Reported on 10/31/2016 02/08/16   Darci CurrentBrown, [place] N, MD  doxycycline (VIBRAMYCIN) 100 MG capsule Take 1 capsule (100 mg total) by mouth 2 (two) times daily. Patient not taking: Reported on 10/31/2016 01/24/16   Nadara MustardHarris, Robert P, MD  ibuprofen (ADVIL,MOTRIN) 600 MG tablet Take 1 tablet (600 mg total) by mouth every 6 (six) hours as needed for mild pain or cramping. Patient not taking: Reported on 10/31/2016 01/25/16   Vena AustriaStaebler, Andreas, MD    Family History No family history on file.  Social History Social History  Substance Use Topics  . Smoking status: Former Smoker    Quit date: 12/21/2015  . Smokeless tobacco: Never Used  . Alcohol use No     Allergies   Bee venom and Shellfish allergy   Review of Systems Review of Systems  Constitutional: Negative for fever.  HENT: Positive for congestion, postnasal drip and rhinorrhea.  Respiratory: Positive for cough, chest tightness, shortness of breath and wheezing.   Cardiovascular: Negative for chest pain.  Gastrointestinal: Negative for abdominal pain, nausea and vomiting.  Endocrine: Negative for polyuria.  Genitourinary: Negative for dysuria and vaginal bleeding.  Musculoskeletal: Negative for back pain.  Skin: Negative for wound.  Neurological: Negative for syncope and headaches.  Hematological: Negative for adenopathy.  Psychiatric/Behavioral: The patient is not nervous/anxious.   All other systems reviewed and are negative.    Physical Exam Updated Vital Signs BP (!) 130/110   Pulse 94   Temp 98.3 F (36.8 C) (Oral)   Resp (!) 24   Ht 5'  3" (1.6 m)   Wt 129.3 kg (285 lb)   LMP 06/04/2016 (LMP Unknown)   SpO2 98%   BMI 50.49 kg/m   Physical Exam  Constitutional: She appears well-developed and well-nourished. No distress.  HENT:  Head: Normocephalic and atraumatic.  Right Ear: Tympanic membrane, external ear and ear canal normal.  Left Ear: Tympanic membrane, external ear and ear canal normal.  Nose: Mucosal edema and rhinorrhea present. No epistaxis. Right sinus exhibits no maxillary sinus tenderness and no frontal sinus tenderness. Left sinus exhibits no maxillary sinus tenderness and no frontal sinus tenderness.  Mouth/Throat: Uvula is midline and mucous membranes are normal. Mucous membranes are not pale and not cyanotic. No oropharyngeal exudate, posterior oropharyngeal edema, posterior oropharyngeal erythema or tonsillar abscesses.  Eyes: Pupils are equal, round, and reactive to light. Conjunctivae are normal.  Neck: Normal range of motion and full passive range of motion without pain.  Cardiovascular: Intact distal pulses.  Tachycardia present.   Pulses:      Radial pulses are 2+ on the right side, and 2+ on the left side.  Pulmonary/Chest: Accessory muscle usage present. No stridor. Tachypnea noted. She is in respiratory distress. She has decreased breath sounds. She has wheezes.  Clear and equal breath sounds without focal wheezes, rhonchi, rales  Abdominal: Soft. There is no tenderness.  Gravid uterus palpable Fetus is active.  Musculoskeletal: Normal range of motion.  Lymphadenopathy:    She has no cervical adenopathy.  Neurological: She is alert.  Skin: Skin is warm and dry. No rash noted. She is not diaphoretic.  Psychiatric: She has a normal mood and affect.  Nursing note and vitals reviewed.    ED Treatments / Results  Labs (all labs ordered are listed, but only abnormal results are displayed) Labs Reviewed - No data to display  EKG  EKG Interpretation  Date/Time:  Friday October 31 2016 01:53:43  EDT Ventricular Rate:  86 PR Interval:    QRS Duration: 98 QT Interval:  366 QTC Calculation: 438 R Axis:   47 Text Interpretation:  Sinus rhythm nonspecific T waves V2-3 Confirmed by Pricilla Loveless [id number]) on 10/31/2016 3:00:30 AM       Radiology No results found.  Procedures Procedures (including critical care time)  Medications Ordered in ED Medications  albuterol (PROVENTIL) (2.5 MG/3ML) 0.083% nebulizer solution 5 mg (not administered)  ipratropium (ATROVENT) nebulizer solution 0.5 mg (not administered)  albuterol (PROVENTIL) (2.5 MG/3ML) 0.083% nebulizer solution 5 mg (5 mg Nebulization Given 10/31/16 0157)  ipratropium (ATROVENT) nebulizer solution 0.5 mg (0.5 mg Nebulization Given 10/31/16 0157)  predniSONE (DELTASONE) tablet 60 mg (60 mg Oral Given 10/31/16 0256)     Initial Impression / Assessment and Plan / ED Course  I have reviewed the triage vital signs and the nursing notes.  Pertinent labs & imaging results that were available  during my care of the patient were reviewed by me and considered in my medical decision making (see chart for details).  Clinical Course as of Nov 01 334  Fri Oct 31, 2016  0238 FHT: 158  [HM]  0309 Pt with decreased SOB and wheezing after initial nebulizer.  [HM]    Clinical Course User Index [HM] Seanpaul Preece, Dahlia Client, [place]    Patient ambulated in ED with O2 saturations maintained >90, no current signs of respiratory distress. Lung exam improved after nebulizer treatment. Prednisone given in the ED and pt will be discharged with 5 day burst. Pt states they are breathing at baseline. Pt has been instructed to continue using prescribed medications and to speak with PCP and OB about today's exacerbation.   Patient evaluated by rapid response OB. Fetal heart rate is within normal limits. Abdomen is soft and nontender. Baby is active.  The patient was discussed with and seen by Dr. Criss Alvine who agrees with the treatment plan.   Final  Clinical Impressions(s) / ED Diagnoses   Final diagnoses:  Moderate persistent asthma with exacerbation    New Prescriptions New Prescriptions   No medications on file     Milta Deiters 10/31/16 0340    Pricilla Loveless, MD 10/31/16 [phone]

## 2016-10-31 NOTE — ED Notes (Signed)
hhn given 

## 2016-10-31 NOTE — ED Notes (Signed)
Hx asthma  Difficulty breathing for 2 days [redacted] weeks pregnant    edc nov  She sees a doctor in [place]  1st child

## 2016-10-31 NOTE — ED Notes (Signed)
Ob rapid response notified  She will see

## 2016-11-27 ENCOUNTER — Encounter (HOSPITAL_COMMUNITY): Payer: Self-pay | Admitting: Emergency Medicine

## 2016-11-27 ENCOUNTER — Emergency Department (HOSPITAL_COMMUNITY)
Admission: EM | Admit: 2016-11-27 | Discharge: 2016-11-27 | Disposition: A | Discharge status: Home / Self Care | Payer: Medicaid Other | Attending: Emergency Medicine | Admitting: Emergency Medicine

## 2016-11-27 DIAGNOSIS — E119 Type 2 diabetes mellitus without complications: Secondary | ICD-10-CM | POA: Insufficient documentation

## 2016-11-27 DIAGNOSIS — J45909 Unspecified asthma, uncomplicated: Secondary | ICD-10-CM | POA: Diagnosis not present

## 2016-11-27 DIAGNOSIS — O368122 Decreased fetal movements, second trimester, fetus 2: Secondary | ICD-10-CM | POA: Diagnosis present

## 2016-11-27 DIAGNOSIS — D649 Anemia, unspecified: Secondary | ICD-10-CM | POA: Diagnosis not present

## 2016-11-27 DIAGNOSIS — Z87891 Personal history of nicotine dependence: Secondary | ICD-10-CM | POA: Diagnosis not present

## 2016-11-27 DIAGNOSIS — Z7982 Long term (current) use of aspirin: Secondary | ICD-10-CM | POA: Insufficient documentation

## 2016-11-27 DIAGNOSIS — I10 Essential (primary) hypertension: Secondary | ICD-10-CM | POA: Diagnosis not present

## 2016-11-27 DIAGNOSIS — Z79899 Other long term (current) drug therapy: Secondary | ICD-10-CM | POA: Diagnosis not present

## 2016-11-27 DIAGNOSIS — Z3492 Encounter for supervision of normal pregnancy, unspecified, second trimester: Secondary | ICD-10-CM | POA: Diagnosis not present

## 2016-11-27 DIAGNOSIS — O26892 Other specified pregnancy related conditions, second trimester: Secondary | ICD-10-CM | POA: Insufficient documentation

## 2016-11-27 DIAGNOSIS — Z3A25 25 weeks gestation of pregnancy: Secondary | ICD-10-CM | POA: Insufficient documentation

## 2016-11-27 LAB — I-STAT BETA HCG BLOOD, ED (MC, WL, AP ONLY): I-stat hCG, quantitative: >2000 m[IU]/mL — ABNORMAL HIGH (ref <5)

## 2016-11-27 NOTE — ED Triage Notes (Addendum)
States [redacted] weeks pregnant and one day ago woke up usually feels movement of baby or when drinking cold water but did not. Has not felt baby moved since. States due date November 22nd and this is her second pregnancy with her last around same time last year she had a miscarriage. Denies pain, vaginal bleeding or discharge.

## 2016-11-27 NOTE — ED Provider Notes (Signed)
[facility] Provider Note   CSN: [id number] Arrival date & time: 11/27/16  1406     History   Chief Complaint Chief Complaint  Patient presents with  . Threatened Miscarriage    HPI Carla Nichols is a 26 y.o. female.  HPI Patient presents to the emergency room for evaluation of lack of fetal movement. Patient is [redacted] weeks pregnant. Her due date is November 22. [facility] [place] [facility]. She has not noticed the baby move normally today.  Patient denies any trouble with nausea vomiting. No fever. No vaginal bleeding or discharge.  No abdominal pain.  No headache.  No shortness of breath. Past Medical History:  Diagnosis Date  . Asthma   . Diabetes mellitus without complication (HCC)   . Dyspnea   . Hypertension   . Seizures (HCC)    as a child    Patient Active Problem List   Diagnosis Date Noted  . Abortion, missed 01/24/2016  . Anemia 01/24/2016    Past Surgical History:  Procedure Laterality Date  . DILATION AND EVACUATION N/A 01/24/2016   Procedure: DILATATION AND EVACUATION;  Surgeon: Nadara Mustardobert P Harris, MD;  Location: [facility] ORS;  Service: Gynecology;  Laterality: N/A;  . TONSILLECTOMY      OB History    Gravida Para Term Preterm AB Living   2 0 0 0 1 0   SAB TAB Ectopic Multiple Live Births   1 0 0 0 0       Home Medications    Prior to Admission medications   Medication Sig Start Date End Date Taking? Authorizing Provider  albuterol (PROVENTIL HFA;VENTOLIN HFA) 108 (90 Base) MCG/ACT inhaler Inhale 2 puffs into the lungs every 6 (six) hours as needed.   Yes [provider]  albuterol (PROVENTIL) (2.5 MG/3ML) 0.083% nebulizer solution Take 3 mLs (2.5 mg total) by nebulization every 4 (four) hours as needed for wheezing or shortness of breath. 10/31/16  Yes Pricilla LovelessGoldston, Scott, MD  aspirin EC 81 MG tablet Take 81 mg by mouth daily.   Yes [provider]  chlorpheniramine-HYDROcodone (TUSSIONEX PENNKINETIC ER) 10-8 MG/5ML SUER  Take 5 mLs by mouth every 12 (twelve) hours as needed for cough. Patient not taking: Reported on 11/27/2016 02/08/16   Darci CurrentBrown, [place] N, MD  doxycycline (VIBRAMYCIN) 100 MG capsule Take 1 capsule (100 mg total) by mouth 2 (two) times daily. Patient not taking: Reported on 10/31/2016 01/24/16   Nadara MustardHarris, Robert P, MD  ferrous fumarate-b12-vitamic C-folic acid (TRINSICON / FOLTRIN) capsule Take 1 capsule by mouth 3 (three) times daily after meals. Patient not taking: Reported on 11/27/2016 01/25/16   Vena AustriaStaebler, Andreas, MD  ibuprofen (ADVIL,MOTRIN) 600 MG tablet Take 1 tablet (600 mg total) by mouth every 6 (six) hours as needed for mild pain or cramping. Patient not taking: Reported on 10/31/2016 01/25/16   Vena AustriaStaebler, Andreas, MD  predniSONE (DELTASONE) 20 MG tablet 2 tabs po daily x 4 days Patient not taking: Reported on 11/27/2016 10/31/16   Pricilla LovelessGoldston, Scott, MD    Family History No family history on file.  Social History Social History  Substance Use Topics  . Smoking status: Former Smoker    Quit date: 12/21/2015  . Smokeless tobacco: Never Used  . Alcohol use No     Allergies   Bee venom and Shellfish allergy   Review of Systems Review of Systems  All other systems reviewed and are negative.    Physical Exam Updated Vital Signs BP 126/65  Pulse 72   Temp 98.1 F (36.7 C) (Oral)   Resp 18   Ht 1.6 m (5\' 3" )   Wt 126.6 kg (279 lb)   LMP 06/04/2016 (LMP Unknown)   SpO2 99%   BMI 49.42 kg/m   Physical Exam  Constitutional: No distress.  Morbidly obese  HENT:  Head: Normocephalic and atraumatic.  Right Ear: External ear normal.  Left Ear: External ear normal.  Eyes: Conjunctivae are normal. Right eye exhibits no discharge. Left eye exhibits no discharge. No scleral icterus.  Neck: Neck supple. No tracheal deviation present.  Cardiovascular: Normal rate, regular rhythm and intact distal pulses.   Pulmonary/Chest: Effort normal and breath sounds normal. No stridor. No  respiratory distress. She has no wheezes. She has no rales.  Abdominal: Soft. Bowel sounds are normal. She exhibits no distension. There is no tenderness. There is no rebound and no guarding.  Musculoskeletal: She exhibits no edema or tenderness.  Neurological: She is alert. She has normal strength. No cranial nerve deficit (no facial droop, extraocular movements intact, no slurred speech) or sensory deficit. She exhibits normal muscle tone. She displays no seizure activity. Coordination normal.  Skin: Skin is warm and dry. No rash noted.  Psychiatric: She has a normal mood and affect.  Nursing note and vitals reviewed.    ED Treatments / Results  Labs (all labs ordered are listed, but only abnormal results are displayed) Labs Reviewed  I-STAT BETA HCG BLOOD, ED ([facility], [facility], [facility] ONLY) - Abnormal; Notable for the following:       Result Value   I-stat hCG, quantitative >2,000.0 (*)    All other components within normal limits    Procedures Procedures (including critical care time)  Medications Ordered in ED Medications - No data to display   Initial Impression / Assessment and Plan / ED Course  I have reviewed the triage vital signs and the nursing notes.  Pertinent labs & imaging results that were available during my care of the patient were reviewed by me and considered in my medical decision making (see chart for details).  Clinical Course as of Nov 28 1643  Thu Nov 27, 2016  1628 Patient was assessed by the rapid OB nurse.  Normal fetal heart tones were noted on the monitor.  [JK]  1629 Pt was placed on the fetal monitors.    [JK]  1642 Discussed with Montez Morita.   Case was discussed with OB MD.  Initial elevated BP noted but repeat is normal.  No other signs to suggest preeclampsia.  Fetal monitoring is reassuring.  Pt is stable for discharge.  Follow up with OB  [JK]    Clinical Course User Index [JK] Linwood Dibbles, MD    Final Clinical Impressions(s) / ED Diagnoses   Final  diagnoses:  Fetal movement present during pregnancy in second trimester    New Prescriptions New Prescriptions   No medications on file     Linwood Dibbles, MD 11/27/16 [phone]

## 2016-11-27 NOTE — ED Notes (Signed)
Portable fetal heart monitor currently being used with another patient.

## 2016-11-27 NOTE — Discharge Instructions (Signed)
Follow up with your doctor, If you have any further issues while you are Carla Nichols, go to [facility] as discussed

## 2016-11-27 NOTE — ED Notes (Signed)
EDP Knapp at bedside. 

## 2016-11-27 NOTE — ED Notes (Signed)
Rapid response OB contacted, will be coming to see the patient

## 2016-11-27 NOTE — Progress Notes (Signed)
1540 Arrived to evaluate this 26 yo G2P0 @ 25.[redacted] wks GA in with complaint of not feeling fetal movement.  Pts OB is in [place].  She has a history of a molar pregnancy, HTN, DM, and morbid obesity.  She has been seeing MFM due to history. She reports no fetal concerns with this baby and only maternal concerns for HTN and DM. She denies vaginal bleeding, LOF and abdominal or back pain. 1600 FHR Category I, no UC's noted.1615  Dr. Debroah LoopArnold of FP notified of pt in ED and of above. He states pt is OB cleared and may be discharged to F/U with regular OB.

## 2016-11-27 NOTE — ED Notes (Signed)
Fetal heart rate with portable completed 135-155 completed by second triage nurse Trish MageAnna RN. Doctor McVilleZammit notified.

## 2016-11-27 NOTE — ED Notes (Signed)
Spoke with ED Doctor regarding orders.  

## 2016-11-27 NOTE — ED Notes (Signed)
Rapid response OB at bedside 

## 2016-12-31 LAB — OB RESULTS CONSOLE HIV ANTIBODY (ROUTINE TESTING): HIV: NONREACTIVE

## 2016-12-31 LAB — OB RESULTS CONSOLE PLATELET COUNT: PLATELETS: 198

## 2016-12-31 LAB — OB RESULTS CONSOLE RPR: RPR: NONREACTIVE

## 2016-12-31 LAB — OB RESULTS CONSOLE HGB/HCT, BLOOD
HEMATOCRIT: 38
Hemoglobin: 13.1

## 2017-01-18 ENCOUNTER — Inpatient Hospital Stay (HOSPITAL_COMMUNITY)
Admission: AD | Admit: 2017-01-18 | Discharge: 2017-01-18 | Disposition: A | Discharge status: Home / Self Care | Payer: No Typology Code available for payment source | Source: Ambulatory Visit | Attending: Family Medicine | Admitting: Family Medicine

## 2017-01-18 ENCOUNTER — Encounter (HOSPITAL_COMMUNITY): Payer: Self-pay

## 2017-01-18 ENCOUNTER — Inpatient Hospital Stay (HOSPITAL_COMMUNITY): Payer: No Typology Code available for payment source

## 2017-01-18 DIAGNOSIS — Z3A32 32 weeks gestation of pregnancy: Secondary | ICD-10-CM | POA: Diagnosis not present

## 2017-01-18 DIAGNOSIS — Z3689 Encounter for other specified antenatal screening: Secondary | ICD-10-CM

## 2017-01-18 DIAGNOSIS — O368130 Decreased fetal movements, third trimester, not applicable or unspecified: Secondary | ICD-10-CM | POA: Insufficient documentation

## 2017-01-18 DIAGNOSIS — O9A219 Injury, poisoning and certain other consequences of external causes complicating pregnancy, unspecified trimester: Secondary | ICD-10-CM | POA: Diagnosis not present

## 2017-01-18 DIAGNOSIS — B373 Candidiasis of vulva and vagina: Secondary | ICD-10-CM

## 2017-01-18 DIAGNOSIS — B3731 Acute candidiasis of vulva and vagina: Secondary | ICD-10-CM

## 2017-01-18 LAB — WET PREP, GENITAL
Clue Cells Wet Prep HPF POC: NONE SEEN
SPERM: NONE SEEN
Trich, Wet Prep: NONE SEEN

## 2017-01-18 MED ORDER — FLUCONAZOLE 150 MG PO TABS: ORAL_TABLET | ORAL | 0 refills | Status: DC

## 2017-01-18 NOTE — Discharge Instructions (Signed)
Fetal Movement Counts °Patient Name: ________________________________________________ Patient Due Date: ____________________ °What is a fetal movement count? °A fetal movement count is the number of times that you feel your baby move during a certain amount of time. This may also be called a fetal kick count. A fetal movement count is recommended for every pregnant woman. You may be asked to start counting fetal movements as early as week 28 of your pregnancy. °Pay attention to when your baby is most active. You may notice your baby's sleep and wake cycles. You may also notice things that make your baby move more. You should do a fetal movement count: °· When your baby is normally most active. °· At the same time each day. ° °A good time to count movements is while you are resting, after having something to eat and drink. °How do I count fetal movements? °1. Find a quiet, comfortable area. Sit, or lie down on your side. °2. Write down the date, the start time and stop time, and the number of movements that you felt between those two times. Take this information with you to your health care visits. °3. For 2 hours, count kicks, flutters, swishes, rolls, and jabs. You should feel at least 10 movements during 2 hours. °4. You may stop counting after you have felt 10 movements. °5. If you do not feel 10 movements in 2 hours, have something to eat and drink. Then, keep resting and counting for 1 hour. If you feel at least 4 movements during that hour, you may stop counting. °Contact a health care provider if: °· You feel fewer than 4 movements in 2 hours. °· Your baby is not moving like he or she usually does. °Date: ____________ Start time: ____________ Stop time: ____________ Movements: ____________ °Date: ____________ Start time: ____________ Stop time: ____________ Movements: ____________ °Date: ____________ Start time: ____________ Stop time: ____________ Movements: ____________ °Date: ____________ Start time:  ____________ Stop time: ____________ Movements: ____________ °Date: ____________ Start time: ____________ Stop time: ____________ Movements: ____________ °Date: ____________ Start time: ____________ Stop time: ____________ Movements: ____________ °Date: ____________ Start time: ____________ Stop time: ____________ Movements: ____________ °Date: ____________ Start time: ____________ Stop time: ____________ Movements: ____________ °Date: ____________ Start time: ____________ Stop time: ____________ Movements: ____________ °This information is not intended to replace advice given to you by your health care provider. Make sure you discuss any questions you have with your health care provider. °Document Released: 05/07/2006 Document Revised: 12/05/2015 Document Reviewed: 05/17/2015 °Elsevier Interactive Patient Education © 2018 Elsevier Inc. ° °Third Trimester of Pregnancy °The third trimester is from week 29 through week 42, months 7 through 9. This trimester is when your unborn baby (fetus) is growing very fast. At the end of the ninth month, the unborn baby is about 20 inches in length. It weighs about 6-10 pounds. °Follow these instructions at home: °· Avoid all smoking, herbs, and alcohol. Avoid drugs not approved by your doctor. °· Do not use any tobacco products, including cigarettes, chewing tobacco, and electronic cigarettes. If you need help quitting, ask your doctor. You may get counseling or other support to help you quit. °· Only take medicine as told by your doctor. Some medicines are safe and some are not during pregnancy. °· Exercise only as told by your doctor. Stop exercising if you start having cramps. °· Eat regular, healthy meals. °· Wear a good support bra if your breasts are tender. °· Do not use hot tubs, steam rooms, or saunas. °· Wear your seat belt   when driving. °· Avoid raw meat, uncooked cheese, and liter boxes and soil used by cats. °· Take your prenatal vitamins. °· Take 1500-2000  milligrams of calcium daily starting at the 20th week of pregnancy until you deliver your baby. °· Try taking medicine that helps you poop (stool softener) as needed, and if your doctor approves. Eat more fiber by eating fresh fruit, vegetables, and whole grains. Drink enough fluids to keep your pee (urine) clear or pale yellow. °· Take warm water baths (sitz baths) to soothe pain or discomfort caused by hemorrhoids. Use hemorrhoid cream if your doctor approves. °· If you have puffy, bulging veins (varicose veins), wear support hose. Raise (elevate) your feet for 15 minutes, 3-4 times a day. Limit salt in your diet. °· Avoid heavy lifting, wear low heels, and sit up straight. °· Rest with your legs raised if you have leg cramps or low back pain. °· Visit your dentist if you have not gone during your pregnancy. Use a soft toothbrush to brush your teeth. Be gentle when you floss. °· You can have sex (intercourse) unless your doctor tells you not to. °· Do not travel far distances unless you must. Only do so with your doctor's approval. °· Take prenatal classes. °· Practice driving to the hospital. °· Pack your hospital bag. °· Prepare the baby's room. °· Go to your doctor visits. °Get help if: °· You are not sure if you are in labor or if your water has broken. °· You are dizzy. °· You have mild cramps or pressure in your lower belly (abdominal). °· You have a nagging pain in your belly area. °· You continue to feel sick to your stomach (nauseous), throw up (vomit), or have watery poop (diarrhea). °· You have bad smelling fluid coming from your vagina. °· You have pain with peeing (urination). °Get help right away if: °· You have a fever. °· You are leaking fluid from your vagina. °· You are spotting or bleeding from your vagina. °· You have severe belly cramping or pain. °· You lose or gain weight rapidly. °· You have trouble catching your breath and have chest pain. °· You notice sudden or extreme puffiness  (swelling) of your face, hands, ankles, feet, or legs. °· You have not felt the baby move in over an hour. °· You have severe headaches that do not go away with medicine. °· You have vision changes. °This information is not intended to replace advice given to you by your health care provider. Make sure you discuss any questions you have with your health care provider. °Document Released: 07/02/2009 Document Revised: 09/13/2015 Document Reviewed: 06/08/2012 °Elsevier Interactive Patient Education © 2017 Elsevier Inc. ° °
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
## 2017-01-19 LAB — GC/CHLAMYDIA PROBE AMP (~~LOC~~) NOT AT ARMC
CHLAMYDIA, DNA PROBE: NEGATIVE
NEISSERIA GONORRHEA: NEGATIVE

## 2017-02-19 ENCOUNTER — Encounter: Payer: Self-pay | Admitting: Obstetrics and Gynecology

## 2017-02-19 ENCOUNTER — Ambulatory Visit: Payer: Self-pay

## 2017-02-19 ENCOUNTER — Ambulatory Visit (INDEPENDENT_AMBULATORY_CARE_PROVIDER_SITE_OTHER): Payer: Medicaid Other | Admitting: Obstetrics and Gynecology

## 2017-02-19 VITALS — BP 105/58 | HR 98 | Wt 304.6 lb

## 2017-02-19 DIAGNOSIS — J4520 Mild intermittent asthma, uncomplicated: Secondary | ICD-10-CM

## 2017-02-19 DIAGNOSIS — O0993 Supervision of high risk pregnancy, unspecified, third trimester: Secondary | ICD-10-CM

## 2017-02-19 DIAGNOSIS — O10019 Pre-existing essential hypertension complicating pregnancy, unspecified trimester: Secondary | ICD-10-CM

## 2017-02-19 DIAGNOSIS — O24313 Unspecified pre-existing diabetes mellitus in pregnancy, third trimester: Secondary | ICD-10-CM | POA: Diagnosis not present

## 2017-02-19 DIAGNOSIS — O99820 Streptococcus B carrier state complicating pregnancy: Secondary | ICD-10-CM | POA: Insufficient documentation

## 2017-02-19 DIAGNOSIS — E119 Type 2 diabetes mellitus without complications: Secondary | ICD-10-CM

## 2017-02-19 DIAGNOSIS — O0990 Supervision of high risk pregnancy, unspecified, unspecified trimester: Secondary | ICD-10-CM | POA: Insufficient documentation

## 2017-02-19 DIAGNOSIS — O169 Unspecified maternal hypertension, unspecified trimester: Secondary | ICD-10-CM | POA: Insufficient documentation

## 2017-02-19 DIAGNOSIS — O10013 Pre-existing essential hypertension complicating pregnancy, third trimester: Secondary | ICD-10-CM | POA: Diagnosis not present

## 2017-02-19 DIAGNOSIS — J45909 Unspecified asthma, uncomplicated: Secondary | ICD-10-CM | POA: Insufficient documentation

## 2017-02-19 DIAGNOSIS — O99519 Diseases of the respiratory system complicating pregnancy, unspecified trimester: Secondary | ICD-10-CM

## 2017-02-19 LAB — POCT URINALYSIS DIP (DEVICE)
Bilirubin Urine: NEGATIVE
Glucose, UA: NEGATIVE mg/dL
KETONES UR: NEGATIVE mg/dL
Leukocytes, UA: NEGATIVE
Nitrite: NEGATIVE
PH: 5.5 (ref 5.0–8.0)
PROTEIN: NEGATIVE mg/dL
Specific Gravity, Urine: 1.015 (ref 1.005–1.030)
Urobilinogen, UA: 0.2 mg/dL (ref 0.0–1.0)

## 2017-02-19 NOTE — Progress Notes (Signed)
Here for initial prenatal visit with our office, [facility] Stqtes yesterday when she got up had some clear fluid ran down her leg, none since then. Enrolled in babyscripts app only.

## 2017-02-19 NOTE — Progress Notes (Signed)
Subjective:  Carla Nichols is a 26 y.o. G2P0010 at 7935w1d being seen today for ongoing prenatal care. She [facility]. Last appt was one week ago. H/O CHTN, stable without BP meds, BASA only. DM previously took Metformin but had been off about 6 months prior to pregnancy. Only checks BS qd d/t BS being well controlled.  Asthma controlled and stable on Pulmicort. Report episode of LOF yesterday but none since. Some vaginal discharge but no bleeding.  She is currently monitored for the following issues for this high-risk pregnancy and has Anemia; Supervision of high risk pregnancy, antepartum; Diabetes mellitus without complication (HCC); Hypertension in pregnancy; Asthma; and GBS (group B Streptococcus carrier), +RV culture, currently pregnant on her problem list.  Patient reports no complaints.  Contractions: Not present. Vag. Bleeding: None.  Movement: Present. Denies leaking of fluid.   The following portions of the patient's history were reviewed and updated as appropriate: allergies, current medications, past family history, past medical history, past social history, past surgical history and problem list. Problem list updated.  Objective:   Vitals:   02/19/17 1309  BP: (!) 105/58  Pulse: 98  Weight: (!) 138.2 kg (304 lb 9.6 oz)    Fetal Status: Fetal Heart Rate (bpm): 142   Movement: Present     General:  Alert, oriented and cooperative. Patient is in no acute distress.  Skin: Skin is warm and dry. No rash noted.   Cardiovascular: Normal heart rate noted  Respiratory: Normal respiratory effort, no problems with respiration noted  Abdomen: Soft, gravid, appropriate for gestational age. Pain/Pressure: Present     Pelvic:  Cervical exam performed        Extremities: Normal range of motion.  Edema: Trace  Mental Status: Normal mood and affect. Normal behavior. Normal judgment and thought content.   Urinalysis:      Assessment and Plan:   Pregnancy: G2P0010 at 1935w1d  1. Supervision of high risk pregnancy, antepartum Stable Fern negative - Enroll Patient in Babyscripts  2. Diabetes mellitus without complication (HCC) Asked pt to check BS fasting and 2 hrs PP Bring readings to all appts.  - Enroll Patient in Babyscripts IOL at 39 weeks Will attempt to obtain Fetal ECHO prior to IOL  3. Mild intermittent asthma, unspecified whether complicated Stable Continue with Pulmicort - Enroll Patient in Babyscripts Continue with weekly BPP/NST  4. Pre-existing essential hypertension during pregnancy, antepartum BP stable No S/Sx of PEC - Enroll Patient in Babyscripts Continue with weekly BPP/NST IOL at 39 weeks  5. GBS (group B Streptococcus carrier), +RV culture, currently pregnant Treat while in lbor  Term labor symptoms and general obstetric precautions including but not limited to vaginal bleeding, contractions, leaking of fluid and fetal movement were reviewed in detail with the patient. Please refer to After Visit Summary for other counseling recommendations.  Return in about 1 week (around 02/26/2017) for OB visit.   Hermina StaggersErvin, Camora Tremain L, MD

## 2017-02-19 NOTE — Patient Instructions (Signed)
[facility] [place] [address] [place], [place]  [address] Phone - [phone]   Fax - [phone]  [facility] [address] [place], [place] [address] Phone - [phone]   Fax - [place] [address], [place]  [address] Phone - [phone]   Fax - [phone]  [address] [place] [place] [address] [place], [place]  [address] Phone - [phone]   Fax - [phone]  [place] [address] [place], [place]  [address] Phone - [phone]   Fax - [phone]  [facility] [address] [place], [place]  [address] Phone - [phone]   Fax - [place] [address], [place], [place]  [address] Phone - [phone]   Fax - [phone]  [place] [address] [place], [place] [address] [place], [place]  [address] Phone - [phone]   Fax - [place] [address] [place], [place]  [address] Phone - [phone]   Fax - [phone] [facility] [place] [address] [place], [place]  [address] Phone - [phone]   Fax - [phone]  [place] [address] [place], [place]  [address] Phone - [phone]   Fax - [phone]  [facility] AT [place], [place], [place]  [address] Phone - [phone]   Fax - [place] [address], [place], [place]  [address] Phone - [phone]   Fax - [phone]  [facility] [address] [place], [place]  [address] Phone - [place], [place]  [address] Phone - [phone]   Fax - [place] [address] [place], [place]  [address] Phone - [phone]   Fax - [phone]  [place] [address] [place], [place]  [address] Phone - [phone]   Fax - [place]. [place], [place]  [address] Phone - [phone]   Fax - [place], [place], [place]  [address] Phone - [phone]   Fax - [place] [address] [place], [place]  [address] Phone - [phone]   Fax - [phone]  [place] [address] [place], [place]  [address] Phone - [phone]   Fax - [place], [place], [place]  [address] Phone - [phone]   Fax - [phone]  [place] [address] [place], [place]  [address] Phone - [phone]   Fax - [phone] [place] [address] [place], [place]  [address] Phone - [phone]   Fax - [place] [address] [place], [place]  [address] Phone - [phone]   Fax - [place] [address] [place],   [address] Phone - [phone]   Fax - [phone]  [place] MD [address] [place] [address] Phone [phone]  Fax [phone]   Places to have your son circumcised:    [facility] [phone] while  are in hospital  [facility] [phone] $244 by 4 wks  [facility] [phone] $[phone] by 2 wks  [facility] [phone] $[phone] by 7 days [facility] [phone] $[phone] by 4 wks  These prices sometimes change but are roughly what you can expect to pay. Please  call and confirm pricing.   Circumcision is considered an elective/non-medically necessary procedure. There are many reasons parents decide to have their sons circumsized. During the first year of life circumcised males have a reduced risk of urinary tract infections but after this year the rates between circumcised males and uncircumcised males are the same.  It is safe to have your son circumcised outside of the hospital and the places above perform them regularly.     

## 2017-02-23 ENCOUNTER — Telehealth (HOSPITAL_COMMUNITY): Payer: Self-pay | Admitting: *Deleted

## 2017-02-23 ENCOUNTER — Encounter (HOSPITAL_COMMUNITY): Payer: Self-pay | Admitting: *Deleted

## 2017-02-23 NOTE — Telephone Encounter (Signed)
Preadmission screen  

## 2017-02-26 ENCOUNTER — Ambulatory Visit (INDEPENDENT_AMBULATORY_CARE_PROVIDER_SITE_OTHER): Payer: Medicaid Other | Admitting: *Deleted

## 2017-02-26 ENCOUNTER — Encounter: Payer: Self-pay | Admitting: Obstetrics and Gynecology

## 2017-02-26 ENCOUNTER — Ambulatory Visit (INDEPENDENT_AMBULATORY_CARE_PROVIDER_SITE_OTHER): Payer: Medicaid Other | Admitting: Family Medicine

## 2017-02-26 ENCOUNTER — Ambulatory Visit: Payer: Self-pay

## 2017-02-26 VITALS — BP 122/65 | HR 93 | Wt 304.8 lb

## 2017-02-26 DIAGNOSIS — O24313 Unspecified pre-existing diabetes mellitus in pregnancy, third trimester: Secondary | ICD-10-CM | POA: Diagnosis not present

## 2017-02-26 DIAGNOSIS — O10019 Pre-existing essential hypertension complicating pregnancy, unspecified trimester: Secondary | ICD-10-CM

## 2017-02-26 DIAGNOSIS — O10013 Pre-existing essential hypertension complicating pregnancy, third trimester: Secondary | ICD-10-CM

## 2017-02-26 DIAGNOSIS — O0993 Supervision of high risk pregnancy, unspecified, third trimester: Secondary | ICD-10-CM

## 2017-02-26 DIAGNOSIS — O0990 Supervision of high risk pregnancy, unspecified, unspecified trimester: Secondary | ICD-10-CM

## 2017-02-26 LAB — POCT URINALYSIS DIP (DEVICE)
BILIRUBIN URINE: NEGATIVE
Glucose, UA: NEGATIVE mg/dL
LEUKOCYTES UA: NEGATIVE
NITRITE: NEGATIVE
PH: 5 (ref 5.0–8.0)
Protein, ur: NEGATIVE mg/dL
Specific Gravity, Urine: >=1.03 (ref 1.005–1.030)
Urobilinogen, UA: 0.2 mg/dL (ref 0.0–1.0)

## 2017-02-26 NOTE — Progress Notes (Signed)
IOL scheduled 11/14.

## 2017-02-26 NOTE — Patient Instructions (Addendum)
Contraception Choices Contraception (birth control) is the use of any methods or devices to prevent pregnancy. Below are some methods to help avoid pregnancy. Hormonal methods  Contraceptive implant. This is a thin, plastic tube containing progesterone hormone. It does not contain estrogen hormone. Your health care provider inserts the tube in the inner part of the upper arm. The tube can remain in place for up to 3 years. After 3 years, the implant must be removed. The implant prevents the ovaries from releasing an egg (ovulation), thickens the cervical mucus to prevent sperm from entering the uterus, and thins the lining of the inside of the uterus.  Progesterone-only injections. These injections are given every 3 months by your health care provider to prevent pregnancy. This synthetic progesterone hormone stops the ovaries from releasing eggs. It also thickens cervical mucus and changes the uterine lining. This makes it harder for sperm to survive in the uterus.  Birth control pills. These pills contain estrogen and progesterone hormone. They work by preventing the ovaries from releasing eggs (ovulation). They also cause the cervical mucus to thicken, preventing the sperm from entering the uterus. Birth control pills are prescribed by a health care provider.Birth control pills can also be used to treat heavy periods.  Minipill. This type of birth control pill contains only the progesterone hormone. They are taken every day of each month and must be prescribed by your health care provider.  Birth control patch. The patch contains hormones similar to those in birth control pills. It must be changed once a week and is prescribed by a health care provider.  Vaginal ring. The ring contains hormones similar to those in birth control pills. It is left in the vagina for 3 weeks, removed for 1 week, and then a new one is put back in place. The patient must be comfortable inserting and removing the ring from  the vagina.A health care provider's prescription is necessary.  Emergency contraception. Emergency contraceptives prevent pregnancy after unprotected sexual intercourse. This pill can be taken right after sex or up to 5 days after unprotected sex. It is most effective the sooner you take the pills after having sexual intercourse. Most emergency contraceptive pills are available without a prescription. Check with your pharmacist. Do not use emergency contraception as your only form of birth control. Barrier methods  Female condom. This is a thin sheath (latex or rubber) that is worn over the penis during sexual intercourse. It can be used with spermicide to increase effectiveness.  Female condom. This is a soft, loose-fitting sheath that is put into the vagina before sexual intercourse.  Diaphragm. This is a soft, latex, dome-shaped barrier that must be fitted by a health care provider. It is inserted into the vagina, along with a spermicidal jelly. It is inserted before intercourse. The diaphragm should be left in the vagina for 6 to 8 hours after intercourse.  Cervical cap. This is a round, soft, latex or plastic cup that fits over the cervix and must be fitted by a health care provider. The cap can be left in place for up to 48 hours after intercourse.  Sponge. This is a soft, circular piece of polyurethane foam. The sponge has spermicide in it. It is inserted into the vagina after wetting it and before sexual intercourse.  Spermicides. These are chemicals that kill or block sperm from entering the cervix and uterus. They come in the form of creams, jellies, suppositories, foam, or tablets. They do not require a prescription. They   are inserted into the vagina with an applicator before having sexual intercourse. The process must be repeated every time you have sexual intercourse. Intrauterine contraception  Intrauterine device (IUD). This is a T-shaped device that is put in a woman's uterus during  a menstrual period to prevent pregnancy. There are 2 types: ? Copper IUD. This type of IUD is wrapped in copper wire and is placed inside the uterus. Copper makes the uterus and fallopian tubes produce a fluid that kills sperm. It can stay in place for 10 years. ? Hormone IUD. This type of IUD contains the hormone progestin (synthetic progesterone). The hormone thickens the cervical mucus and prevents sperm from entering the uterus, and it also thins the uterine lining to prevent implantation of a fertilized egg. The hormone can weaken or kill the sperm that get into the uterus. It can stay in place for 3-5 years, depending on which type of IUD is used. Permanent methods of contraception  Female tubal ligation. This is when the woman's fallopian tubes are surgically sealed, tied, or blocked to prevent the egg from traveling to the uterus.  Hysteroscopic sterilization. This involves placing a small coil or insert into each fallopian tube. Your doctor uses a technique called hysteroscopy to do the procedure. The device causes scar tissue to form. This results in permanent blockage of the fallopian tubes, so the sperm cannot fertilize the egg. It takes about 3 months after the procedure for the tubes to become blocked. You must use another form of birth control for these 3 months.  Female sterilization. This is when the female has the tubes that carry sperm tied off (vasectomy).This blocks sperm from entering the vagina during sexual intercourse. After the procedure, the man can still ejaculate fluid (semen). Natural planning methods  Natural family planning. This is not having sexual intercourse or using a barrier method (condom, diaphragm, cervical cap) on days the woman could become pregnant.  Calendar method. This is keeping track of the length of each menstrual cycle and identifying when you are fertile.  Ovulation method. This is avoiding sexual intercourse during ovulation.  Symptothermal method.  This is avoiding sexual intercourse during ovulation, using a thermometer and ovulation symptoms.  Post-ovulation method. This is timing sexual intercourse after you have ovulated. Regardless of which type or method of contraception you choose, it is important that you use condoms to protect against the transmission of sexually transmitted infections (STIs). Talk with your health care provider about which form of contraception is most appropriate for you. This information is not intended to replace advice given to you by your health care provider. Make sure you discuss any questions you have with your health care provider. Document Released: 04/07/2005 Document Revised: 09/13/2015 Document Reviewed: 09/30/2012 Elsevier Interactive Patient Education  2017 [employer]  

## 2017-02-26 NOTE — Progress Notes (Signed)
   PRENATAL VISIT NOTE  Subjective:  Carla Nichols is a 26 y.o. G2P0010 at 4162w2d being seen today for ongoing prenatal care.  She is currently monitored for the following issues for this high-risk pregnancy and has Anemia; Supervision of high risk pregnancy, antepartum; Diabetes mellitus without complication (HCC); Hypertension in pregnancy; Asthma affecting pregnancy, antepartum; GBS (group B Streptococcus carrier), +RV culture, currently pregnant; and Benign essential HTN, chronic, antepartum on their problem list.  Patient reports no complaints.  Contractions: Irregular. Vag. Bleeding: None.  Movement: Present. Denies leaking of fluid.   The following portions of the patient's history were reviewed and updated as appropriate: allergies, current medications, past family history, past medical history, past social history, past surgical history and problem list. Problem list updated.  Objective:   Vitals:   02/26/17 1500  BP: 122/65  Pulse: 93  Weight: (!) 304 lb 12.8 oz (138.3 kg)    Fetal Status:     Movement: Present     General:  Alert, oriented and cooperative. Patient is in no acute distress.  Skin: Skin is warm and dry. No rash noted.   Cardiovascular: Normal heart rate noted  Respiratory: Normal respiratory effort, no problems with respiration noted  Abdomen: Soft, gravid, appropriate for gestational age.  Pain/Pressure: Present     Pelvic: Cervical exam deferred        Extremities: Normal range of motion.  Edema: Trace  Mental Status:  Normal mood and affect. Normal behavior. Normal judgment and thought content.  EFW 5 lb 12 oz No book today Reports FBS in 80s 2  Hour pp 90s postprandial NST:  Baseline: 120 bpm, Variability: Good {> 6 bpm), Accelerations: Reactive and Decelerations: Absent BPP 8/8 Assessment and Plan:  Pregnancy: G2P0010 at 662w2d  1. Supervision of high risk pregnancy, antepartum Continue prenatal care.   2. Pre-existing essential hypertension  during pregnancy, antepartum For IOL at 39 wks On no meds BP at goal-reassuring Fetal testing  3. Pre-existing diabetes mellitus during pregnancy in third trimester BS at goal per her report Continue diet  Preterm labor symptoms and general obstetric precautions including but not limited to vaginal bleeding, contractions, leaking of fluid and fetal movement were reviewed in detail with the patient. Please refer to After Visit Summary for other counseling recommendations.  Return in about 5 weeks (around 04/02/2017) for PP visit.  IOL on 11/14.   Reva Boresanya S Roseline Ebarb, MD

## 2017-02-27 ENCOUNTER — Telehealth: Payer: Self-pay | Admitting: *Deleted

## 2017-02-27 NOTE — Progress Notes (Signed)
Late entry:  Pt informed that the ultrasound is considered a limited OB ultrasound and is not intended to be a complete ultrasound exam.  Patient also informed that the ultrasound is not being completed with the intent of assessing for fetal or placental anomalies or any pelvic abnormalities.  Explained that the purpose of today's ultrasound is to assess for presentation, BPP and amniotic fluid volume.  Patient acknowledges the purpose of the exam and the limitations of the study.

## 2017-02-27 NOTE — Telephone Encounter (Signed)
Late entry:  [facility]. She stated that Dr. Clent RidgesWalsh was able to work pt in for the exam on 11/12 @ 0900 [facility]. I advised that I would speak with the ordering physician due to the fact that pt is scheduled for IOL on 11/14. I consulted with Dr. Alysia PennaErvin today and it was determined that since the study could not be done until 2 days prior to IOL date and all fetal testing thus far has been good, pt would not need to have the fetal echo. I called Tiffany and cancelled the appt adding that we were very appreciative of the effort and care they gave to try and accommodate our patient.

## 2017-02-28 ENCOUNTER — Encounter (HOSPITAL_COMMUNITY): Payer: Self-pay

## 2017-02-28 ENCOUNTER — Inpatient Hospital Stay (HOSPITAL_COMMUNITY)
Admission: AD | Admit: 2017-02-28 | Discharge: 2017-02-28 | Disposition: A | Discharge status: Home / Self Care | Payer: Medicaid Other | Source: Ambulatory Visit | Attending: Obstetrics & Gynecology | Admitting: Obstetrics & Gynecology

## 2017-02-28 DIAGNOSIS — R030 Elevated blood-pressure reading, without diagnosis of hypertension: Secondary | ICD-10-CM | POA: Insufficient documentation

## 2017-02-28 DIAGNOSIS — O9989 Other specified diseases and conditions complicating pregnancy, childbirth and the puerperium: Secondary | ICD-10-CM | POA: Diagnosis not present

## 2017-02-28 DIAGNOSIS — Z87891 Personal history of nicotine dependence: Secondary | ICD-10-CM | POA: Insufficient documentation

## 2017-02-28 DIAGNOSIS — O10913 Unspecified pre-existing hypertension complicating pregnancy, third trimester: Secondary | ICD-10-CM

## 2017-02-28 DIAGNOSIS — E119 Type 2 diabetes mellitus without complications: Secondary | ICD-10-CM | POA: Diagnosis not present

## 2017-02-28 DIAGNOSIS — O26893 Other specified pregnancy related conditions, third trimester: Secondary | ICD-10-CM | POA: Diagnosis not present

## 2017-02-28 DIAGNOSIS — O24113 Pre-existing diabetes mellitus, type 2, in pregnancy, third trimester: Secondary | ICD-10-CM | POA: Diagnosis not present

## 2017-02-28 DIAGNOSIS — Z3A38 38 weeks gestation of pregnancy: Secondary | ICD-10-CM | POA: Diagnosis not present

## 2017-02-28 DIAGNOSIS — R51 Headache: Secondary | ICD-10-CM | POA: Diagnosis present

## 2017-02-28 LAB — CBC WITH DIFFERENTIAL/PLATELET
BASOS ABS: 0 10*3/uL (ref 0.0–0.1)
Basophils Relative: 0 %
Eosinophils Absolute: 0.3 10*3/uL (ref 0.0–0.7)
Eosinophils Relative: 3 %
HEMATOCRIT: 39.1 % (ref 36.0–46.0)
Hemoglobin: 13.3 g/dL (ref 12.0–15.0)
LYMPHS ABS: 2.6 10*3/uL (ref 0.7–4.0)
LYMPHS PCT: 25 %
MCH: 30.4 pg (ref 26.0–34.0)
MCHC: 34 g/dL (ref 30.0–36.0)
MCV: 89.5 fL (ref 78.0–100.0)
MONO ABS: 0.9 10*3/uL (ref 0.1–1.0)
MONOS PCT: 8 %
NEUTROS ABS: 6.9 10*3/uL (ref 1.7–7.7)
Neutrophils Relative %: 65 %
Platelets: 192 10*3/uL (ref 150–400)
RBC: 4.37 MIL/uL (ref 3.87–5.11)
RDW: 13.2 % (ref 11.5–15.5)
WBC: 10.6 10*3/uL — ABNORMAL HIGH (ref 4.0–10.5)

## 2017-02-28 LAB — URINALYSIS, ROUTINE W REFLEX MICROSCOPIC
Bilirubin Urine: NEGATIVE
GLUCOSE, UA: NEGATIVE mg/dL
Hgb urine dipstick: NEGATIVE
KETONES UR: NEGATIVE mg/dL
Nitrite: NEGATIVE
PH: 8 (ref 5.0–8.0)
Protein, ur: NEGATIVE mg/dL
SPECIFIC GRAVITY, URINE: 1.011 (ref 1.005–1.030)

## 2017-02-28 LAB — COMPREHENSIVE METABOLIC PANEL
ALT: 13 U/L — ABNORMAL LOW (ref 14–54)
AST: 17 U/L (ref 15–41)
Albumin: 3 g/dL — ABNORMAL LOW (ref 3.5–5.0)
Alkaline Phosphatase: 112 U/L (ref 38–126)
Anion gap: 8 (ref 5–15)
BILIRUBIN TOTAL: 0.4 mg/dL (ref 0.3–1.2)
BUN: 7 mg/dL (ref 6–20)
CO2: 21 mmol/L — ABNORMAL LOW (ref 22–32)
Calcium: 8.7 mg/dL — ABNORMAL LOW (ref 8.9–10.3)
Chloride: 104 mmol/L (ref 101–111)
Creatinine, Ser: 0.58 mg/dL (ref 0.44–1.00)
GFR calc Af Amer: >60 mL/min (ref >60)
Glucose, Bld: 73 mg/dL (ref 65–99)
POTASSIUM: 4.1 mmol/L (ref 3.5–5.1)
Sodium: 133 mmol/L — ABNORMAL LOW (ref 135–145)
TOTAL PROTEIN: 7.4 g/dL (ref 6.5–8.1)

## 2017-02-28 LAB — PROTEIN / CREATININE RATIO, URINE
Creatinine, Urine: 68 mg/dL
PROTEIN CREATININE RATIO: 0.1 mg/mg{creat} (ref 0.00–0.15)
Total Protein, Urine: 7 mg/dL

## 2017-02-28 MED ORDER — PROMETHAZINE HCL 25 MG/ML IJ SOLN
12.5000 mg | Freq: Once | INTRAMUSCULAR | Status: AC
  Administered 2017-02-28: 12.5 mg via INTRAMUSCULAR
  Filled 2017-02-28: qty 1

## 2017-02-28 MED ORDER — NALBUPHINE HCL 10 MG/ML IJ SOLN
10.0000 mg | Freq: Once | INTRAMUSCULAR | Status: AC
  Administered 2017-02-28: 10 mg via INTRAMUSCULAR
  Filled 2017-02-28: qty 1

## 2017-02-28 NOTE — Discharge Instructions (Signed)

## 2017-02-28 NOTE — MAU Provider Note (Signed)
History   G2P0010 @ 38.3 wks in with c/o headache unrelieved by fioricet. Pt has hx of headaches and has hx of migraines. States feels like eyes goig to pop out of head and seeing spots. Denies epigastric pain, ROM or vag bleeding.  CSN: [id number]  Arrival date & time 02/28/17  [id number]   None     Chief Complaint  Patient presents with  . Headache    HPI  Past Medical History:  Diagnosis Date  . Asthma    pulmicort and albuterol inhaler as needed  . Diabetes mellitus without complication (HCC)    type 2 diet controlled  . Dyspnea   . Hypertension    no meds  . Seizures (HCC)    as a child    Past Surgical History:  Procedure Laterality Date  . TONSILLECTOMY    . WISDOM TOOTH EXTRACTION      Family History  Problem Relation Age of Onset  . Diabetes Mother   . Arthritis Mother   . Hypertension Father   . Arthritis Father   . Diabetes Maternal Grandmother   . Diabetes Paternal Grandmother   . Hypertension Paternal Grandmother     Social History   Tobacco Use  . Smoking status: Former Smoker    Last attempt to quit: 12/21/2015    Years since quitting: 1.1  . Smokeless tobacco: Never Used  Substance Use Topics  . Alcohol use: No  . Drug use: No    OB History    Gravida Para Term Preterm AB Living   2 0 0 0 1 0   SAB TAB Ectopic Multiple Live Births   0 0 0 0 0      Review of Systems  Constitutional: Negative.   HENT: Negative.   Eyes: Positive for photophobia and visual disturbance.  Respiratory: Negative.   Cardiovascular: Negative.   Gastrointestinal: Negative.   Endocrine: Negative.   Genitourinary: Negative.   Musculoskeletal: Negative.   Skin: Negative.   Allergic/Immunologic: Negative.   Neurological: Positive for headaches.  Hematological: Negative.   Psychiatric/Behavioral: Negative.     Allergies  Bee venom and Shellfish allergy  Home Medications    BP (!) 148/77   Pulse 89   Temp 98.5 F (36.9 C) (Oral)   Resp 18   LMP  06/04/2016 (LMP Unknown)   Physical Exam  Constitutional: She is oriented to person, place, and time. She appears well-developed and well-nourished.  HENT:  Head: Normocephalic.  Eyes: Pupils are equal, round, and reactive to light.  Neck: Normal range of motion.  Cardiovascular: Normal rate, regular rhythm, normal heart sounds and intact distal pulses.  Pulmonary/Chest: Effort normal and breath sounds normal.  Abdominal: Soft. Bowel sounds are normal.  Musculoskeletal: Normal range of motion.  Neurological: She is alert and oriented to person, place, and time. She has normal reflexes.  Skin: Skin is warm and dry.  Psychiatric: She has a normal mood and affect. Her behavior is normal. Judgment and thought content normal.    MAU Course  Procedures (including critical care time)  Labs Reviewed  CBC WITH DIFFERENTIAL/PLATELET  COMPREHENSIVE METABOLIC PANEL  URINALYSIS, ROUTINE W REFLEX MICROSCOPIC  PROTEIN / CREATININE RATIO, URINE   [place] Fetal Bpp W/nonstress  Result Date: 02/27/2017 ----------------------------------------------------------------------  OBSTETRICS REPORT                      (Signed Final 02/27/2017 11:18 am) ---------------------------------------------------------------------- Patient Info  ID #:  [id number]                          D.O.B.:  [birth year] (26 yrs)  Name:       Carla Nichols             Visit Date: 02/26/2017 03:55 pm ---------------------------------------------------------------------- Performed By  Performed By:     Sedalia Mutaiane Day RNC          Ref. Address:     [address]                                                             [address]                                                             [place]  Attending:        Tinnie Gensanya Pratt MD         Secondary Phy.:   MAU Nursing-                                                             MAU/Triage  Referred By:      Curtis SitesLISA A LEFTWICH-       [facility]         [facility] ---------------------------------------------------------------------- Orders   #  Description                                 Code   1  US FETAL BPP W/NONSTRESS                    [id number]  ----------------------------------------------------------------------   #  Ordered By               Order #        Accession #    Episode #   1  Tinnie GensANYA PRATT              [id number]      [phone]     [id number]  ---------------------------------------------------------------------- Service(s) Provided   US Fetal BPP W NST  29562  ---------------------------------------------------------------------- Indications   [redacted] weeks gestation of pregnancy                Z3A.38   Unspecified diabetes in pregnancy, third       O24.313   trimester   Pre-existing essential hypertension            O10.019   complicating pregnancy, unspecified   trimester  ---------------------------------------------------------------------- OB History  Gravidity:    1         Term:   0        Prem:   0        SAB:   1  TOP:          0       Ectopic:  0        Living: 0 ---------------------------------------------------------------------- Fetal Evaluation  Num Of Fetuses:     1  Preg. Location:     Intrauterine  Cardiac Activity:   Observed  Presentation:       Cephalic  Amniotic Fluid  AFI FV:      Subjectively within normal limits  AFI Sum(cm)     %Tile       Largest Pocket(cm)  12.95           49          5.36  RUQ(cm)       RLQ(cm)       LUQ(cm)        LLQ(cm)  1.94          2.9           5.36           2.75 ---------------------------------------------------------------------- Biophysical Evaluation  Amniotic F.V:   Pocket => 2 cm two         F. Tone:        Observed                  planes  F. Movement:    Observed                   N.S.T:          Reactive  F. Breathing:   Observed                   Score:           10/10 ---------------------------------------------------------------------- Gestational Age  Clinical EDD:  38w 1d                                        EDD:   03/11/17  Best:          38w 1d     Det. By:  Clinical EDD             EDD:   03/11/17 ---------------------------------------------------------------------- Impression  BPP 8/8 ---------------------------------------------------------------------- Recommendations  For IOL at 39 wks  Reassuring antenatal testing ----------------------------------------------------------------------                   Tinnie Gens, MD Electronically Signed Final Report   02/27/2017 11:18 am ----------------------------------------------------------------------    No diagnosis found.    MDM  BP sl elevated, DTR's trace, no edema, FHR pattern reassuring with 15x15 accels no decels.  10:50 headache resolved. PIH labs all normal VSS. Will d/c home

## 2017-02-28 NOTE — MAU Note (Signed)
Headache since last night- [facility]. Does not remember name. States she feels hot and is seeing spots. No vaginal bleeding, no LOF. Irregular ctx. +FM

## 2017-03-01 ENCOUNTER — Other Ambulatory Visit: Payer: Self-pay | Admitting: Family Medicine

## 2017-03-01 DIAGNOSIS — O0990 Supervision of high risk pregnancy, unspecified, unspecified trimester: Secondary | ICD-10-CM

## 2017-03-04 ENCOUNTER — Inpatient Hospital Stay (HOSPITAL_COMMUNITY)
Admission: RE | Admit: 2017-03-04 | Discharge: 2017-03-07 | DRG: 807 | Disposition: A | Discharge status: Home / Self Care | Payer: Medicaid Other | Source: Ambulatory Visit | Attending: Obstetrics and Gynecology | Admitting: Obstetrics and Gynecology

## 2017-03-04 ENCOUNTER — Inpatient Hospital Stay (HOSPITAL_COMMUNITY): Payer: Medicaid Other | Admitting: Anesthesiology

## 2017-03-04 ENCOUNTER — Other Ambulatory Visit: Payer: Self-pay

## 2017-03-04 ENCOUNTER — Encounter (HOSPITAL_COMMUNITY): Payer: Self-pay

## 2017-03-04 VITALS — BP 124/65 | HR 82 | Temp 98.5°F | Resp 20

## 2017-03-04 DIAGNOSIS — O99214 Obesity complicating childbirth: Secondary | ICD-10-CM | POA: Diagnosis present

## 2017-03-04 DIAGNOSIS — O1002 Pre-existing essential hypertension complicating childbirth: Secondary | ICD-10-CM | POA: Diagnosis present

## 2017-03-04 DIAGNOSIS — Z3A39 39 weeks gestation of pregnancy: Secondary | ICD-10-CM

## 2017-03-04 DIAGNOSIS — O10019 Pre-existing essential hypertension complicating pregnancy, unspecified trimester: Secondary | ICD-10-CM

## 2017-03-04 DIAGNOSIS — O99824 Streptococcus B carrier state complicating childbirth: Secondary | ICD-10-CM | POA: Diagnosis present

## 2017-03-04 DIAGNOSIS — O9952 Diseases of the respiratory system complicating childbirth: Secondary | ICD-10-CM | POA: Diagnosis present

## 2017-03-04 DIAGNOSIS — Z3490 Encounter for supervision of normal pregnancy, unspecified, unspecified trimester: Secondary | ICD-10-CM

## 2017-03-04 DIAGNOSIS — Z880 Allergy status to penicillin: Secondary | ICD-10-CM | POA: Diagnosis not present

## 2017-03-04 DIAGNOSIS — O99519 Diseases of the respiratory system complicating pregnancy, unspecified trimester: Secondary | ICD-10-CM

## 2017-03-04 DIAGNOSIS — O1092 Unspecified pre-existing hypertension complicating childbirth: Secondary | ICD-10-CM | POA: Diagnosis not present

## 2017-03-04 DIAGNOSIS — O24420 Gestational diabetes mellitus in childbirth, diet controlled: Secondary | ICD-10-CM | POA: Diagnosis present

## 2017-03-04 DIAGNOSIS — J45909 Unspecified asthma, uncomplicated: Secondary | ICD-10-CM | POA: Diagnosis present

## 2017-03-04 DIAGNOSIS — O2402 Pre-existing diabetes mellitus, type 1, in childbirth: Secondary | ICD-10-CM | POA: Diagnosis not present

## 2017-03-04 DIAGNOSIS — O0990 Supervision of high risk pregnancy, unspecified, unspecified trimester: Secondary | ICD-10-CM

## 2017-03-04 DIAGNOSIS — E119 Type 2 diabetes mellitus without complications: Secondary | ICD-10-CM

## 2017-03-04 DIAGNOSIS — E109 Type 1 diabetes mellitus without complications: Secondary | ICD-10-CM | POA: Diagnosis not present

## 2017-03-04 LAB — CBC
HCT: 38.6 % (ref 36.0–46.0)
HEMOGLOBIN: 13.3 g/dL (ref 12.0–15.0)
MCH: 30.9 pg (ref 26.0–34.0)
MCHC: 34.5 g/dL (ref 30.0–36.0)
MCV: 89.6 fL (ref 78.0–100.0)
Platelets: 200 10*3/uL (ref 150–400)
RBC: 4.31 MIL/uL (ref 3.87–5.11)
RDW: 13 % (ref 11.5–15.5)
WBC: 11.4 10*3/uL — ABNORMAL HIGH (ref 4.0–10.5)

## 2017-03-04 LAB — GLUCOSE, CAPILLARY
GLUCOSE-CAPILLARY: 54 mg/dL — AB (ref 65–99)
GLUCOSE-CAPILLARY: 99 mg/dL (ref 65–99)
GLUCOSE-CAPILLARY: 63 mg/dL — AB (ref 65–99)
GLUCOSE-CAPILLARY: 64 mg/dL — AB (ref 65–99)
Glucose-Capillary: 67 mg/dL (ref 65–99)
Glucose-Capillary: 88 mg/dL (ref 65–99)
Glucose-Capillary: 79 mg/dL (ref 65–99)
Glucose-Capillary: 99 mg/dL (ref 65–99)
Glucose-Capillary: 83 mg/dL (ref 65–99)

## 2017-03-04 LAB — OB RESULTS CONSOLE GBS: GBS: POSITIVE

## 2017-03-04 LAB — TYPE AND SCREEN
ABO/RH(D): A POS
Antibody Screen: NEGATIVE

## 2017-03-04 LAB — RPR: RPR: NONREACTIVE

## 2017-03-04 MED ORDER — OXYCODONE-ACETAMINOPHEN 5-325 MG PO TABS: 1.0000 | ORAL_TABLET | ORAL | Status: DC | PRN

## 2017-03-04 MED ORDER — OXYTOCIN BOLUS FROM INFUSION
500.0000 mL | Freq: Once | INTRAVENOUS | Status: AC
  Administered 2017-03-05: 500 mL via INTRAVENOUS

## 2017-03-04 MED ORDER — PENICILLIN G POT IN DEXTROSE 60000 UNIT/ML IV SOLN
3.0000 10*6.[IU] | INTRAVENOUS | Status: DC
  Administered 2017-03-04 – 2017-03-05 (×4): 3 10*6.[IU] via INTRAVENOUS
  Filled 2017-03-04 (×7): qty 50

## 2017-03-04 MED ORDER — OXYTOCIN 40 UNITS IN LACTATED RINGERS INFUSION - SIMPLE MED
2.5000 [IU]/h | INTRAVENOUS | Status: DC
  Filled 2017-03-04: qty 1000

## 2017-03-04 MED ORDER — PHENYLEPHRINE 40 MCG/ML (10ML) SYRINGE FOR IV PUSH (FOR BLOOD PRESSURE SUPPORT)
80.0000 ug | PREFILLED_SYRINGE | INTRAVENOUS | Status: DC | PRN
  Filled 2017-03-04: qty 5

## 2017-03-04 MED ORDER — TERBUTALINE SULFATE 1 MG/ML IJ SOLN
0.2500 mg | Freq: Once | INTRAMUSCULAR | Status: DC | PRN
  Filled 2017-03-04: qty 1

## 2017-03-04 MED ORDER — OXYCODONE-ACETAMINOPHEN 5-325 MG PO TABS: 2.0000 | ORAL_TABLET | ORAL | Status: DC | PRN

## 2017-03-04 MED ORDER — SOD CITRATE-CITRIC ACID 500-334 MG/5ML PO SOLN: 30.0000 mL | ORAL | Status: DC | PRN

## 2017-03-04 MED ORDER — FENTANYL 2.5 MCG/ML BUPIVACAINE 1/10 % EPIDURAL INFUSION (WH - ANES)
14.0000 mL/h | INTRAMUSCULAR | Status: DC | PRN
  Administered 2017-03-04 (×2): 14 mL/h via EPIDURAL
  Filled 2017-03-04 (×2): qty 100

## 2017-03-04 MED ORDER — LIDOCAINE HCL (PF) 1 % IJ SOLN
30.0000 mL | INTRAMUSCULAR | Status: DC | PRN
  Filled 2017-03-04: qty 30

## 2017-03-04 MED ORDER — FLEET ENEMA 7-19 GM/118ML RE ENEM: 1.0000 | ENEMA | RECTAL | Status: DC | PRN

## 2017-03-04 MED ORDER — DIPHENHYDRAMINE HCL 50 MG/ML IJ SOLN: 12.5000 mg | INTRAMUSCULAR | Status: DC | PRN

## 2017-03-04 MED ORDER — ACETAMINOPHEN 325 MG PO TABS: 650.0000 mg | ORAL_TABLET | ORAL | Status: DC | PRN

## 2017-03-04 MED ORDER — FENTANYL CITRATE (PF) 100 MCG/2ML IJ SOLN
100.0000 ug | INTRAMUSCULAR | Status: DC | PRN
  Filled 2017-03-04: qty 2

## 2017-03-04 MED ORDER — DEXTROSE 5 % IV SOLN
5.0000 10*6.[IU] | Freq: Once | INTRAVENOUS | Status: AC
  Administered 2017-03-04: 5 10*6.[IU] via INTRAVENOUS
  Filled 2017-03-04: qty 5

## 2017-03-04 MED ORDER — MISOPROSTOL 25 MCG QUARTER TABLET: 25.0000 ug | ORAL_TABLET | ORAL | Status: DC | PRN

## 2017-03-04 MED ORDER — ONDANSETRON HCL 4 MG/2ML IJ SOLN
4.0000 mg | Freq: Four times a day (QID) | INTRAMUSCULAR | Status: DC | PRN
  Administered 2017-03-04: 4 mg via INTRAVENOUS
  Filled 2017-03-04: qty 2

## 2017-03-04 MED ORDER — FENTANYL 2.5 MCG/ML BUPIVACAINE 1/10 % EPIDURAL INFUSION (WH - ANES): 14.0000 mL/h | INTRAMUSCULAR | Status: DC | PRN

## 2017-03-04 MED ORDER — EPHEDRINE 5 MG/ML INJ
10.0000 mg | INTRAVENOUS | Status: DC | PRN
  Filled 2017-03-04: qty 2

## 2017-03-04 MED ORDER — PHENYLEPHRINE 40 MCG/ML (10ML) SYRINGE FOR IV PUSH (FOR BLOOD PRESSURE SUPPORT)
80.0000 ug | PREFILLED_SYRINGE | INTRAVENOUS | Status: DC | PRN
  Filled 2017-03-04 (×2): qty 10
  Filled 2017-03-04: qty 5

## 2017-03-04 MED ORDER — DEXTROSE-NACL 5-0.45 % IV SOLN
INTRAVENOUS | Status: DC
  Administered 2017-03-04: 19:00:00 via INTRAVENOUS

## 2017-03-04 MED ORDER — MISOPROSTOL 200 MCG PO TABS: 50.0000 ug | ORAL_TABLET | ORAL | Status: DC | PRN

## 2017-03-04 MED ORDER — FENTANYL CITRATE (PF) 100 MCG/2ML IJ SOLN
100.0000 ug | INTRAMUSCULAR | Status: DC | PRN
  Administered 2017-03-04: 100 ug via INTRAVENOUS

## 2017-03-04 MED ORDER — OXYTOCIN 40 UNITS IN LACTATED RINGERS INFUSION - SIMPLE MED
1.0000 m[IU]/min | INTRAVENOUS | Status: DC
  Administered 2017-03-04: 2 m[IU]/min via INTRAVENOUS

## 2017-03-04 MED ORDER — LIDOCAINE HCL (PF) 1 % IJ SOLN
INTRAMUSCULAR | Status: DC | PRN
  Administered 2017-03-04 (×2): 4 mL

## 2017-03-04 MED ORDER — LACTATED RINGERS IV SOLN: 500.0000 mL | INTRAVENOUS | Status: DC | PRN

## 2017-03-04 MED ORDER — LACTATED RINGERS IV SOLN
INTRAVENOUS | Status: DC
  Administered 2017-03-04: 21:00:00 via INTRAUTERINE

## 2017-03-04 MED ORDER — MISOPROSTOL 25 MCG QUARTER TABLET
25.0000 ug | ORAL_TABLET | ORAL | Status: DC | PRN
  Administered 2017-03-04: 25 ug via ORAL
  Filled 2017-03-04: qty 1

## 2017-03-04 MED ORDER — LACTATED RINGERS IV SOLN
INTRAVENOUS | Status: DC
  Administered 2017-03-04 (×2): via INTRAVENOUS

## 2017-03-04 MED ORDER — LACTATED RINGERS IV SOLN: 500.0000 mL | Freq: Once | INTRAVENOUS | Status: DC

## 2017-03-04 MED ORDER — FENTANYL CITRATE (PF) 100 MCG/2ML IJ SOLN: 100.0000 ug | INTRAMUSCULAR | Status: DC | PRN

## 2017-03-04 NOTE — Progress Notes (Signed)
   Carla Nichols is a 26 y.o. G2P0010 at 1475w0d  admitted for induction of labor due to [place].   Subjective: Comfortable with epidural.   Objective: Vitals:   03/04/17 1401 03/04/17 1431 03/04/17 1502 03/04/17 1531  BP: (!) 111/93 121/71 126/64 (!) 102/44  Pulse: 86 82 88 85  Resp: 20 18 20 18   Temp:      TempSrc:      SpO2:       Total I/O In: -  Out: 350 [Urine:350]  FHT:  FHR: 130 bpm, variability: moderate,  accelerations:  Present,  decelerations:  Absent UC:   Irregular SVE:   Dilation: 5.5 Effacement (%): 70 Station: -2 Exam by:: Valentina Lucks. Woods, RN Pitocin @ 10 mu/min  Labs: Lab Results  Component Value Date   WBC 11.4 (H) 03/04/2017   HGB 13.3 03/04/2017   HCT 38.6 03/04/2017   MCV 89.6 03/04/2017   PLT 200 03/04/2017    Assessment / Plan: Augmentation of labor, progressing well AROM at 1600, clear Labor: Progressing normally Fetal Wellbeing:  Category I Pain Control:  Epidural Anticipated MOD:  NSVD  Carla Nichols 03/04/2017, 4:15 PM

## 2017-03-04 NOTE — Anesthesia Pain Management Evaluation Note (Signed)
  CRNA Pain Management Visit Note  Patient: Carla Nichols, 26 y.o., female  "Hello I am a member of the anesthesia team at [facility]. We have an anesthesia team available at all times to provide care throughout the hospital, including epidural management and anesthesia for C-section. I don't know your plan for the delivery whether it a natural birth, water birth, IV sedation, nitrous supplementation, doula or epidural, but we want to meet your pain goals."   1.Was your pain managed to your expectations on prior hospitalizations?   No prior hospitalizations  2.What is your expectation for pain management during this hospitalization?     Epidural  3.How can we help you reach that goal? Epidural @ pain goal.  Record the patient's initial score and the patient's pain goal.   Pain: 0  Pain Goal: 3 [facility] wants you to be able to say your pain was always managed very well.  Carla Nichols 03/04/2017

## 2017-03-04 NOTE — Anesthesia Procedure Notes (Signed)
Epidural Patient location during procedure: OB  Staffing Anesthesiologist: Tameron Lama, MD Performed: anesthesiologist   Preanesthetic Checklist Completed: patient identified, pre-op evaluation, timeout performed, IV checked, risks and benefits discussed and monitors and equipment checked  Epidural Patient position: sitting Prep: site prepped and draped and DuraPrep Patient monitoring: heart rate, continuous pulse ox and blood pressure Approach: midline Location: L2-L3 Injection technique: LOR air and LOR saline  Needle:  Needle type: Tuohy  Needle gauge: 17 G Needle length: 9 cm Needle insertion depth: 9 cm Catheter type: closed end flexible Catheter size: 19 Gauge Catheter at skin depth: 14 cm Test dose: negative  Assessment Sensory level: T8 Events: blood not aspirated, injection not painful, no injection resistance, negative IV test and no paresthesia  Additional Notes Reason for block:procedure for pain     

## 2017-03-04 NOTE — H&P (Signed)
Obstetric History and Physical  Carla Nichols is a 26 y.o. G2P0010 with IUP at 225w0d presenting for IOL for cHTN. Pregnancy complicated by A1GDM as well and asthma.   Prenatal Course [facility] Pregnancy complications or risks: Patient Active Problem List   Diagnosis Date Noted  . Benign essential HTN, chronic, antepartum 02/26/2017  . Supervision of high risk pregnancy, antepartum 02/19/2017  . Diabetes mellitus without complication (HCC) 02/19/2017  . Hypertension in pregnancy 02/19/2017  . Asthma affecting pregnancy, antepartum 02/19/2017  . GBS (group B Streptococcus carrier), +RV culture, currently pregnant 02/19/2017  . Anemia 01/24/2016   She plans to breastfeed She desires undecided for postpartum contraception.   Clinic  [facility] Prenatal Labs  Dating  LMP  Blood type: --/--/A POS (06/26 0920)   Genetic Screen 1 Screen:    AFP:     Quad:     NIPS: Antibody: negative 07/31/16  Anatomic US  10/23/16 [facility] Rubella:  immune 07/31/16  GTT Early:    n/a           Third trimester:  RPR: Nonreactive (09/12 0000)   Flu vaccine  01/26/17 HBsAg: Negative (04/12 0000)   TDaP vaccine     01/26/17                                          Rhogam: HIV: Non-reactive (09/12 0000)   Baby Food      Breast                                         GBS: (For PCN allergy, check sensitivities) postiive  Contraception  undecided WUJ:WJXBJYNWPap:negative 07/31/16  Circumcision  unsure-information given   Pediatrician  unsure 02/19/17 lists given CF:  Support Person   boyfriend Evelena LeydenJose Garcia SMA  Prenatal Classes  n/a37weeks now Hgb electrophoresis:    Prenatal Transfer Tool  Maternal Diabetes: Yes:  Diabetes Type:  Diet controlled Maternal Ultrasounds/Referrals: Normal Fetal Ultrasounds or other Referrals:  None Maternal Substance Abuse:  No Significant Maternal Medications:  None Significant Maternal Lab Results: Lab values include: Group B Strep positive  Past Medical History:  Diagnosis Date  .  Asthma    pulmicort and albuterol inhaler as needed  . Diabetes mellitus without complication (HCC)    type 2 diet controlled  . Dyspnea   . Hypertension    no meds  . Seizures (HCC)    as a child    Past Surgical History:  Procedure Laterality Date  . TONSILLECTOMY    . WISDOM TOOTH EXTRACTION      OB History  Gravida Para Term Preterm AB Living  2 0 0 0 1 0  SAB TAB Ectopic Multiple Live Births  0 0 0 0 0    # Outcome Date GA Lbr Len/2nd Weight Sex Delivery Anes PTL Lv  2 Current           1 Molar 01/24/16 1681w0d            Complications: Molar pregnancy      Social History   Socioeconomic History  . Marital status: Single    Spouse name: Not on file  . Number of children: Not on file  . Years of education: Not on file  . Highest education level: Not  on file  Social Needs  . Financial resource strain: Not on file  . Food insecurity - worry: Not on file  . Food insecurity - inability: Not on file  . Transportation needs - medical: Not on file  . Transportation needs - non-medical: Not on file  Occupational History  . Not on file  Tobacco Use  . Smoking status: Former Smoker    Last attempt to quit: 12/21/2015    Years since quitting: 1.2  . Smokeless tobacco: Never Used  Substance and Sexual Activity  . Alcohol use: No  . Drug use: No  . Sexual activity: Yes    Birth control/protection: None  Other Topics Concern  . Not on file  Social History Narrative  . Not on file    Family History  Problem Relation Age of Onset  . Diabetes Mother   . Arthritis Mother   . Hypertension Father   . Arthritis Father   . Diabetes Maternal Grandmother   . Diabetes Paternal Grandmother   . Hypertension Paternal Grandmother     Medications Prior to Admission  Medication Sig Dispense Refill Last Dose  . albuterol (PROVENTIL HFA;VENTOLIN HFA) 108 (90 Base) MCG/ACT inhaler Inhale 2 puffs into the lungs every 6 (six) hours as needed.   Past Month at Unknown time  .  albuterol (PROVENTIL) (2.5 MG/3ML) 0.083% nebulizer solution Take 3 mLs (2.5 mg total) by nebulization every 4 (four) hours as needed for wheezing or shortness of breath. 30 vial 0 02/27/2017 at Unknown time  . Blood Glucose Monitoring Suppl (GLUCOCOM BLOOD GLUCOSE MONITOR) DEVI by Does not apply route.   Taking  . Budesonide 90 MCG/ACT inhaler Inhale into the lungs.   02/28/2017 at Unknown time  . Butalbital-APAP-Caffeine 50-300-40 MG CAPS Take 1 capsule daily as needed by mouth (For headache.).    02/27/2017 at Unknown time  . Prenatal Vit-Fe Fumarate-FA (PRENATAL MULTIVITAMIN) TABS tablet Take 1 tablet daily at 12 noon by mouth.   02/27/2017 at Unknown time    Allergies  Allergen Reactions  . Bee Venom Swelling  . Shellfish Allergy Anaphylaxis    Betadine OK    Review of Systems: Negative except for what is mentioned in HPI.  Physical Exam: LMP 06/04/2016 (LMP Unknown)  CONSTITUTIONAL: Well-developed, well-nourished female in no acute distress.  HENT:  Normocephalic, atraumatic, External right and left ear normal. Oropharynx is clear and moist EYES: Conjunctivae and EOM are normal. Pupils are equal, round, and reactive to light. No scleral icterus.  NECK: Normal range of motion, supple, no masses SKIN: Skin is warm and dry. No rash noted. Not diaphoretic. No erythema. No pallor. NEUROLOGIC: Alert and oriented to person, place, and time. Normal reflexes, muscle tone coordination. No cranial nerve deficit noted. PSYCHIATRIC: Normal mood and affect. Normal behavior. Normal judgment and thought content. CARDIOVASCULAR: Normal heart rate noted, regular rhythm RESPIRATORY: Effort and breath sounds normal, no problems with respiration noted ABDOMEN: Soft, nontender, nondistended, gravid. MUSCULOSKELETAL: Normal range of motion. No edema and no tenderness. 2+ distal pulses.  Cervical Exam: Dilatation 3cm   Effacement 50%   Station -2   Presentation: cephalic FHT:  Baseline rate 150 bpm    Variability moderate  Accelerations present   Decelerations none Pertinent Labs/Studies:   No results found for this or any previous visit (from the past 24 hour(s)).  Assessment : Carla Nichols is a 26 y.o. G2P0010 at [redacted]w[redacted]d being admitted for induction of labor due to [facility] and A1GDM.  Plan: Labor:  Induction/Augmentation as ordered as per protocol. Analgesia as needed. FWB: Reassuring fetal heart tracing.  GBS positive Delivery plan: Hopeful for vaginal delivery  Chubb Corporationmber Gal Feldhaus, DO

## 2017-03-04 NOTE — Progress Notes (Signed)
   Carla Nichols is a 26 y.o. G2P0010 at 3547w0d  [facility]controlled [facility].   Subjective: Patient doing well; has epidural in place.   Objective: Vitals:   03/04/17 1249 03/04/17 1301 03/04/17 1331 03/04/17 1401  BP: (!) 106/53 122/72 126/69 (!) 111/93  Pulse: 95 90 80 86  Resp: 18 20 18 20   Temp:      TempSrc:      SpO2:       Total I/O In: -  Out: 350 [Urine:350]  FHT:  FHR: 125 bpm, variability: moderate,  accelerations:  Present,  decelerations:  Absent UC:   irregular, every 3 minutes SVE:   Dilation: 5.5 Effacement (%): 70 Station: -2 Exam by:: Valentina Lucks. Woods, RN Pitocin @ 6 mu/min  Labs: Lab Results  Component Value Date   WBC 11.4 (H) 03/04/2017   HGB 13.3 03/04/2017   HCT 38.6 03/04/2017   MCV 89.6 03/04/2017   PLT 200 03/04/2017    Assessment / Plan: Induction of labor due to gestational diabetes,  progressing well on pitocin and chronic hypertension.   Labor: Progressing on Pitocin, will continue to increase then AROM Fetal Wellbeing:  Category I Pain Control:  Epidural Anticipated MOD:  NSVD  Charlesetta GaribaldiKathryn Lorraine Kooistra 03/04/2017, 2:20 PM

## 2017-03-04 NOTE — Progress Notes (Signed)
Carla Nichols is a 26 y.o. G2P0010 at [redacted]w[redacted]d  admitted for induction of labor [facility] [place].   Subjective: Comfortable with epidural. No concerns.  Objective: Vitals:   03/04/17 1901 03/04/17 1930 03/04/17 2000 03/04/17 2030  BP: 105/85 108/78 129/87 (!) 120/54  Pulse: 97 79 80 76  Resp: 20 16 14 14   Temp:   98.5 F (36.9 C)   TempSrc:   Oral   SpO2:       No intake/output data recorded.  FHT:  FHR: 130 bpm, variability: moderate,  accelerations:  Present,  decelerations:  Present variables UC:   2-3 min SVE:   Dilation: 5.5 Effacement (%): 90 Station: -2 Exam by:: C Futures traderullivan RN   Pitocin off  Labs: Lab Results  Component Value Date   WBC 11.4 (H) 03/04/2017   HGB 13.3 03/04/2017   HCT 38.6 03/04/2017   MCV 89.6 03/04/2017   PLT 200 03/04/2017    Assessment / Plan: Augmentation of labor, progressing well  IOL for cHTN, A1GDM AROM at 1600, clear Labor: Progressing normally, s/p FB, cytotec, Was on pit but cutt off due to variables. Amnioinfusion started. Will try labor ball to help with descent   GDM: CBGs controlled. Due to A1GDm status will stop trending as not needed. CHTN: BPs controlled. Monitor. Fetal Wellbeing:  Category II Pain Control:  Epidural Anticipated MOD:  NSVD  Carla AdaJazma Phelps, DO 03/04/2017, 9:20 PM

## 2017-03-04 NOTE — Anesthesia Preprocedure Evaluation (Signed)
Anesthesia Evaluation  Patient identified by MRN, date of birth, ID band Patient awake    Reviewed: Allergy & Precautions, H&P , NPO status , Patient's Chart, lab work & pertinent test results  History of Anesthesia Complications Negative for: history of anesthetic complications  Airway Mallampati: II  TM Distance: >3 FB Neck ROM: full    Dental no notable dental hx. (+) Poor Dentition, Chipped   Pulmonary shortness of breath and with exertion, asthma , former smoker,    Pulmonary exam normal breath sounds clear to auscultation       Cardiovascular Exercise Tolerance: Good hypertension, + DOE  Normal cardiovascular exam Rhythm:regular Rate:Normal     Neuro/Psych Seizures -, Well Controlled,  negative psych ROS   GI/Hepatic Neg liver ROS, GERD  Controlled,  Endo/Other  diabetes, Type obesity  Renal/GU      Musculoskeletal   Abdominal   Peds  Hematology negative hematology ROS (+) anemia ,   Anesthesia Other Findings Past Medical History: No date: Asthma No date: Diabetes mellitus without complication (HCC) No date: Dyspnea No date: Hypertension No date: Seizures (HCC)     Comment: as a child  Past Surgical History: No date: TONSILLECTOMY  BMI    Body Mass Index:  49.25 kg/m      Reproductive/Obstetrics (+) Pregnancy                             Anesthesia Physical  Anesthesia Plan  ASA: IV  Anesthesia Plan: Epidural   Post-op Pain Management:    Induction:   PONV Risk Score and Plan:   Airway Management Planned:   Additional Equipment:   Intra-op Plan:   Post-operative Plan:   Informed Consent: I have reviewed the patients History and Physical, chart, labs and discussed the procedure including the risks, benefits and alternatives for the proposed anesthesia with the patient or authorized representative who has indicated his/her understanding and  acceptance.     Plan Discussed with:   Anesthesia Plan Comments:         Anesthesia Quick Evaluation

## 2017-03-05 ENCOUNTER — Encounter (HOSPITAL_COMMUNITY): Payer: Self-pay

## 2017-03-05 DIAGNOSIS — O2402 Pre-existing diabetes mellitus, type 1, in childbirth: Secondary | ICD-10-CM

## 2017-03-05 DIAGNOSIS — Z3A39 39 weeks gestation of pregnancy: Secondary | ICD-10-CM

## 2017-03-05 DIAGNOSIS — E109 Type 1 diabetes mellitus without complications: Secondary | ICD-10-CM

## 2017-03-05 DIAGNOSIS — O1092 Unspecified pre-existing hypertension complicating childbirth: Secondary | ICD-10-CM

## 2017-03-05 LAB — CBC
HCT: 36.5 % (ref 36.0–46.0)
HEMOGLOBIN: 12.4 g/dL (ref 12.0–15.0)
MCH: 31 pg (ref 26.0–34.0)
MCHC: 34 g/dL (ref 30.0–36.0)
MCV: 91.3 fL (ref 78.0–100.0)
PLATELETS: 164 10*3/uL (ref 150–400)
RBC: 4 MIL/uL (ref 3.87–5.11)
RDW: 13 % (ref 11.5–15.5)
WBC: 16.4 10*3/uL — ABNORMAL HIGH (ref 4.0–10.5)

## 2017-03-05 LAB — GLUCOSE, CAPILLARY
GLUCOSE-CAPILLARY: 71 mg/dL (ref 65–99)
Glucose-Capillary: 67 mg/dL (ref 65–99)

## 2017-03-05 MED ORDER — SENNOSIDES-DOCUSATE SODIUM 8.6-50 MG PO TABS
2.0000 | ORAL_TABLET | ORAL | Status: DC
  Administered 2017-03-06 – 2017-03-07 (×2): 2 via ORAL
  Filled 2017-03-05 (×2): qty 2

## 2017-03-05 MED ORDER — TETANUS-DIPHTH-ACELL PERTUSSIS 5-2.5-18.5 LF-MCG/0.5 IM SUSP: 0.5000 mL | Freq: Once | INTRAMUSCULAR | Status: DC

## 2017-03-05 MED ORDER — COCONUT OIL OIL
1.0000 "application " | TOPICAL_OIL | Status: DC | PRN
  Administered 2017-03-07: 1 via TOPICAL
  Filled 2017-03-05: qty 120

## 2017-03-05 MED ORDER — ONDANSETRON HCL 4 MG PO TABS: 4.0000 mg | ORAL_TABLET | ORAL | Status: DC | PRN

## 2017-03-05 MED ORDER — ACETAMINOPHEN 325 MG PO TABS
650.0000 mg | ORAL_TABLET | ORAL | Status: DC | PRN
  Administered 2017-03-05 – 2017-03-07 (×4): 650 mg via ORAL
  Filled 2017-03-05 (×4): qty 2

## 2017-03-05 MED ORDER — SIMETHICONE 80 MG PO CHEW: 80.0000 mg | CHEWABLE_TABLET | ORAL | Status: DC | PRN

## 2017-03-05 MED ORDER — ONDANSETRON HCL 4 MG/2ML IJ SOLN: 4.0000 mg | INTRAMUSCULAR | Status: DC | PRN

## 2017-03-05 MED ORDER — IBUPROFEN 600 MG PO TABS
600.0000 mg | ORAL_TABLET | Freq: Four times a day (QID) | ORAL | Status: DC
  Administered 2017-03-05 – 2017-03-07 (×9): 600 mg via ORAL
  Filled 2017-03-05 (×10): qty 1

## 2017-03-05 MED ORDER — DIPHENHYDRAMINE HCL 25 MG PO CAPS: 25.0000 mg | ORAL_CAPSULE | Freq: Four times a day (QID) | ORAL | Status: DC | PRN

## 2017-03-05 MED ORDER — DIBUCAINE 1 % RE OINT: 1.0000 "application " | TOPICAL_OINTMENT | RECTAL | Status: DC | PRN

## 2017-03-05 MED ORDER — BENZOCAINE-MENTHOL 20-0.5 % EX AERO
1.0000 "application " | INHALATION_SPRAY | CUTANEOUS | Status: DC | PRN
  Filled 2017-03-05: qty 56

## 2017-03-05 MED ORDER — WITCH HAZEL-GLYCERIN EX PADS: 1.0000 "application " | MEDICATED_PAD | CUTANEOUS | Status: DC | PRN

## 2017-03-05 MED ORDER — ZOLPIDEM TARTRATE 5 MG PO TABS: 5.0000 mg | ORAL_TABLET | Freq: Every evening | ORAL | Status: DC | PRN

## 2017-03-05 MED ORDER — PRENATAL MULTIVITAMIN CH
1.0000 | ORAL_TABLET | Freq: Every day | ORAL | Status: DC
  Administered 2017-03-05 – 2017-03-07 (×3): 1 via ORAL
  Filled 2017-03-05 (×3): qty 1

## 2017-03-05 NOTE — Anesthesia Postprocedure Evaluation (Signed)
Anesthesia Post Note  Patient: Antionette Fairyda Pizarro Vazquez  Procedure(s) Performed: AN AD HOC LABOR EPIDURAL     Patient location during evaluation: Mother Baby Anesthesia Type: Epidural Level of consciousness: awake and alert and oriented Pain management: pain level controlled Vital Signs Assessment: post-procedure vital signs reviewed and stable Respiratory status: spontaneous breathing and nonlabored ventilation Cardiovascular status: stable Postop Assessment: no headache, no apparent nausea or vomiting, no backache, adequate PO intake, epidural receding and patient able to bend at knees Anesthetic complications: no    Last Vitals:  Vitals:   03/05/17 0500 03/05/17 0840  BP: 119/72 134/64  Pulse: 73 80  Resp: 20 18  Temp: 36.9 C 37.3 C  SpO2:  99%    Last Pain:  Vitals:   03/05/17 1129  TempSrc:   PainSc: 0-No pain   Pain Goal: Patients Stated Pain Goal: 5 (03/05/17 0300)               Donnalee CurryMalinova,Anant Agard Hristova

## 2017-03-05 NOTE — Lactation Note (Signed)
This note was copied from a baby's chart. Lactation Consultation Note  Patient Name: Carla Nichols's Date: 03/05/2017 Reason for consult: Initial assessment Breastfeeding consultation services and support information given to patient.  This is mom's first baby and newborn is 1715 hours old.  Baby has been spoon fed twice.  Mom reports baby just recently latched and fed well for 20 minutes.  Instructed on feeding with any cue using good waking techniques and breast massage.  Mom is active with WIC.  Encouraged to call out for assist/concerns prn.  Maternal Data Does the patient have breastfeeding experience prior to this delivery?: No  Feeding Feeding Type: Breast Fed Length of feed: 20 min  LATCH Score                   Interventions    Lactation Tools Discussed/Used WIC Program: Yes   Consult Status Consult Status: Follow-up Date: 03/06/17 Follow-up type: In-patient    Huston FoleyMOULDEN, Yukie Bergeron S 03/05/2017, 4:19 PM

## 2017-03-06 NOTE — Clinical Social Work Maternal (Signed)
CLINICAL SOCIAL WORK MATERNAL/CHILD NOTE  Patient Details  Name: Carla Nichols MRN: [id number] Date of Birth: [birth year]  Date:  03/06/2017  Clinical Social Worker Initiating Note:  Laurey Arrow Date/Time: Initiated:  03/06/17/1151     Child's Name:  Carla Nichols   Biological Parents:  Mother, Father   Need for Interpreter:  None   Reason for Referral:  Current Substance Use/Substance Use During Pregnancy , Behavioral Health Concerns   Address:  [address] [place] [address]    Phone number:  [phone] (home)     Additional phone number:   Household Members/Support Persons (HM/SP):   Household Member/Support Person 1   HM/SP Name Relationship DOB or Age  HM/SP -[place] FOB [birth year]17/[birth year]85  HM/SP -2        HM/SP -3        HM/SP -4        HM/SP -5        HM/SP -6        HM/SP -7        HM/SP -8          Natural Supports (not living in the home):  Extended Family, Parent, Friends, Immediate Family   Professional Supports: None   Employment: Unemployed   Type of Work:     Education:      Homebound arranged:    Museum/gallery curator Resources:  Medicaid   Other Resources:  [employer], Physicist, medical    Cultural/Religious Considerations Which May Impact Care:  Per [employer], MOB is Engineer, manufacturing  Strengths:  Ability to meet basic needs , Home prepared for child , Pediatrician chosen   Psychotropic Medications:         Pediatrician:    [place] area  Pediatrician List:   [facility] for [place]      Pediatrician Fax Number:    Risk Factors/Current Problems:  Substance Use    Cognitive State:  Able to Concentrate , Alert , Goal Oriented , Insightful , Linear Thinking    Mood/Affect:  Bright , Relaxed , Happy , Comfortable , Interested    CSW Assessment: CSW met with MOB to complete an assessment for MH hx and SA hx.   When CSW arrived, MOB was in the recliner and FOB was holding infant.  CSW explained CSW role and MOB gave CSW permission to have FOB present while CSW completed the assessment. CSW offered to have a Spanish interpreter present due to FOB's limited English; MOB declined.   CSW asked about MOB's MH hx and MOB denied a hx.  CSW asked about cutting and MOB acknowledged cutting over 5 years and communicated "That's a part of my life that is behind me."  CSW praised for [facility] for no longer cutting and provided MOB with information about PPD. CSW provided education regarding Baby Blues vs PMADs.  CSW encouraged MOB to evaluate her mental health throughout the postpartum period with the use of the New Mom Checklist developed by Postpartum Progress and notify a medical professional if symptoms arise.  CSW assessed for safety and MOB denied SI and HI.  MOB did not present with any acute signs or symptoms.   CSW inquired about MOB's substance use and MOB acknowledged the use of marijuana during pregnancy.  MOB denied the use of all other illicit substance.  MOB reported MOB's last use  of marijuana was about 5 weeks ago. CSW offered MOB SA resources and MOB declined.  CSW informed MOB of the hospital's policy and procedures regarding perinatal SA. MOB was made aware of the 2 drug screenings for the infant.  MOB was understanding and did not have any questions. CSW informed MOB that the infant's UDS is negative and CSW will continue to monitor infant's CDS.  MOB explained to MOB if infant's CDS is positive without and explanation, CSW are will make a report to [facility] CPS.   MOB reports having all necessary items for infant and feeling prepared to parent.    CSW Plan/Description:  Perinatal Mood and Anxiety Disorder (PMADs) Education, [place], CSW Will Continue to Monitor Umbilical Cord Tissue Drug Screen Results and Make Report if Warranted, No Further Intervention Required/No  Barriers to Discharge, Sudden Infant Death Syndrome (SIDS) Education, Other Information/Referral to [employer], MSW, [employer] Social Work [phone]   Dimple Nanas, [place] 03/06/2017, 12:53 PM

## 2017-03-06 NOTE — Lactation Note (Signed)
This note was copied from a baby's chart. Lactation Consultation Note Mom called out for latch assistance. Mom holding baby in cradle position swaddled, t-shirt, and hat. Mom didn't try herself to latch.  LC set up position for football, unswaddle baby, taught mom "C" hold to guide breast and talked mom through latching. Demonstrated chin tug. Placed prop under moms hand for support while feeding. Discussed feeding tech. Cues, breast compressions, massage, hand expression, and encouraged STS.  Noted baby dimpling while suckling. Mom has dimples, mom stated the baby has dimples also. Discussed in regular situation dimpling meant not a deep latch. Cheeks to breast. Noted occasional swallow. Praised mom for doing good with the latching.   Patient Name: Carla Nichols ZOXWR'UToday's Date: 03/06/2017 Reason for consult: Mother's request;Difficult latch   Maternal Data    Feeding Feeding Type: Breast Fed Length of feed: 10 min(still BF)  LATCH Score Latch: Grasps breast easily, tongue down, lips flanged, rhythmical sucking.  Audible Swallowing: A few with stimulation  Type of Nipple: Flat  Comfort (Breast/Nipple): Soft / non-tender  Hold (Positioning): Assistance needed to correctly position infant at breast and maintain latch.  LATCH Score: 7  Interventions    Lactation Tools Discussed/Used     Consult Status Consult Status: Follow-up Date: 03/06/17 Follow-up type: In-patient    Lamonda Noxon, Diamond NickelLAURA G 03/06/2017, 2:30 AM

## 2017-03-06 NOTE — Progress Notes (Signed)
POSTPARTUM PROGRESS NOTE  Post Partum Day 1 Subjective:  Carla Nichols is a 26 y.o. G2P1011 8533w1d s/p NSVD.  No acute events overnight.  Pt denies problems with ambulating, voiding or po intake.  She denies nausea or vomiting.  Pain is well controlled.   Lochia Minimal.   Objective: Blood pressure (!) 131/55, pulse 69, temperature 99.5 F (37.5 C), temperature source Oral, resp. rate 20, last menstrual period 06/04/2016, SpO2 99 %, unknown if currently breastfeeding.  Physical Exam:  General: alert, cooperative and no distress Lochia:normal flow Chest: no respiratory distress Heart:regular rate, distal pulses intact Abdomen: soft, nontender,  Uterine Fundus: firm, appropriately tender DVT Evaluation: No calf swelling or tenderness Extremities: no edema  Recent Labs    03/04/17 0733 03/05/17 0255  HGB 13.3 12.4  HCT 38.6 36.5    Assessment/Plan:  ASSESSMENT: Carla Nichols is a 26 y.o. G2P1011 4233w1d s/p NSVD.  Plan for discharge tomorrow   LOS: 2 days   Marvel Mcphillips MossMD 03/06/2017, 9:59 AM

## 2017-03-06 NOTE — Lactation Note (Signed)
This note was copied from a baby's chart. Lactation Consultation Note Mom called for latch assistance. LC arrived within 4 minutes of call. Mom stated FOB picked baby up and baby stopped crying and is sleeping. Baby ate 30 min. Ago. Noted baby swaddled, w/outfit, hat, mittens. Asked mom is she BF STS? Mom stated no. Encouraged to BF STS. Asked to assess breast. Mom has wide space tubular breast w/small flat compressible nipples, large areola. Hand expressed easy colostrum, mom has been giving colostrum via spoon. 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 Suggested BF in football position. Encouraged to call for assistance. Discussed newborn feeding habits. Patient Name: Boy Antionette Fairyda Pizarro Vazquez [id number]'XToday's Date: 03/06/2017 Reason for consult: Mother's request   Maternal Data    Feeding Feeding Type: Formula  LATCH Score       Type of Nipple: Flat  Comfort (Breast/Nipple): Soft / non-tender        Interventions    Lactation Tools Discussed/Used     Consult Status Consult Status: Follow-up Date: 03/06/17 Follow-up type: In-patient    Charyl DancerCARVER, Marlisa Caridi G 03/06/2017, 12:14 AM

## 2017-03-07 NOTE — Lactation Note (Addendum)
This note was copied from a baby's chart. Lactation Consultation Note  Patient Name: Carla Nichols EAVWU'JToday's Date: 03/07/2017 Reason for consult: Follow-up assessment   Baby 60 hours old < 6 lbs. Baby has not stooled in 24hours. Mother recently pumped and gave baby 7 ml. Observed latch with intermittent sucks and swallows. Suggest post pumping after every feeding and giving baby back volume pumped. Suggest giving baby formula supplementation in addition but mother declined at this time. [facility]  Discussed keeping feedings to q 3 hours.   Baby breastfed for approx 30 min after an additional 3 ml supplement of breastmilk. Baby has not stooled yet but is passing gas.  Urine dark. Recommend mother start supplementing with formula 18-25 ml. Reviewed volume guidelines. Suggest mother pump 4-6 times per day and give volume back to baby with the difference w/ formula.       Maternal Data    Feeding Feeding Type: Breast Fed Nipple Type: Slow - flow Length of feed: 20 min  LATCH Score Latch: Grasps breast easily, tongue down, lips flanged, rhythmical sucking.  Audible Swallowing: A few with stimulation  Type of Nipple: Flat  Comfort (Breast/Nipple): Soft / non-tender  Hold (Positioning): No assistance needed to correctly position infant at breast.  LATCH Score: 8  Interventions Interventions: DEBP;Hand express  Lactation Tools Discussed/Used     Consult Status Consult Status: Follow-up Date: 03/08/17 Follow-up type: In-patient    [name], [name] 03/07/2017, 1:36 PM

## 2017-03-07 NOTE — Discharge Summary (Signed)
OB Discharge Summary     Patient Name: Carla Nichols DOB: 1990-08-10 MRN: [id number]  Date of admission: 03/04/2017 Delivering MD: Arlyce HarmanLOCKAMY, TIMOTHY   Date of discharge: 03/07/2017  Admitting diagnosis: INDUCTION Intrauterine pregnancy: 2461w1d     Secondary diagnosis:  Active Problems:   Pregnancy   SVD (spontaneous vaginal delivery)  Additional problems: GDM-diet controlled; cHTN      Discharge diagnosis: Term Pregnancy Delivered                                                                                                Post partum procedures:NA  Augmentation: AROM, Pitocin, Cytotec and Foley Balloon  Complications: None  Hospital course:  Induction of Labor With Vaginal Delivery   26 y.o. yo G2P1011 at 3661w1d was admitted to the hospital 03/04/2017 for induction of labor.  Indication for induction: A1 DM and cHTN.  Patient had an uncomplicated labor course as follows: Membrane Rupture Time/Date: 4:01 PM ,03/04/2017   Intrapartum Procedures: Episiotomy: None [1]                                         Lacerations:  1st degree [2];Labial [10]  Patient had delivery of a Viable infant.  Information for the patient's newborn:  Carla Nichols, Boy [name] [[id number]][[id number]]  Delivery Method: Vaginal, Spontaneous(Filed from Delivery Summary)   03/05/2017  Details of delivery can be found in separate delivery note.  Patient had a routine postpartum course. Patient is discharged home 03/07/17.  Physical exam  Vitals:   03/05/17 0840 03/05/17 1655 03/06/17 1819 03/07/17 0542  BP: 134/64 (!) 131/55 131/60 124/65  Pulse: 80 69 70 82  Resp: 18 20 20 20   Temp: 99.1 F (37.3 C) 99.5 F (37.5 C) 99.2 F (37.3 C) 98.5 F (36.9 C)  TempSrc: Oral Oral Oral   SpO2: 99% 99%  98%   General: alert, cooperative and no distress Lochia: appropriate Uterine Fundus: firm Incision: N/A DVT Evaluation: No evidence of DVT seen on physical exam. Labs: Lab Results  Component Value Date   WBC 16.4 (H) 03/05/2017   HGB 12.4 03/05/2017   HCT 36.5 03/05/2017   MCV 91.3 03/05/2017   PLT 164 03/05/2017   CMP Latest Ref Rng & Units 02/28/2017  Glucose 65 - 99 mg/dL 73  BUN 6 - 20 mg/dL 7  Creatinine 7.820.44 - 9.561.00 mg/dL 2.130.58  Sodium 086135 - 578145 mmol/L 133(L)  Potassium 3.5 - 5.1 mmol/L 4.1  Chloride 101 - 111 mmol/L 104  CO2 22 - 32 mmol/L 21(L)  Calcium 8.9 - 10.3 mg/dL 4.6(N8.7(L)  Total Protein 6.5 - 8.1 g/dL 7.4  Total Bilirubin 0.3 - 1.2 mg/dL 0.4  Alkaline Phos 38 - 126 U/L 112  AST 15 - 41 U/L 17  ALT 14 - 54 U/L 13(L)    Discharge instruction: per After Visit Summary and "Baby and Me Booklet".  After visit meds:  Allergies as of 03/07/2017      Reactions   Bee Venom Swelling  Shellfish Allergy Anaphylaxis   Betadine OK      Medication List    STOP taking these medications   Butalbital-APAP-Caffeine 50-300-40 MG Caps   GLUCOCOM BLOOD GLUCOSE MONITOR Devi     TAKE these medications   albuterol 108 (90 Base) MCG/ACT inhaler Commonly known as:  PROVENTIL HFA;VENTOLIN HFA Inhale 2 puffs into the lungs every 6 (six) hours as needed.   albuterol (2.5 MG/3ML) 0.083% nebulizer solution Commonly known as:  PROVENTIL Take 3 mLs (2.5 mg total) by nebulization every 4 (four) hours as needed for wheezing or shortness of breath.   prenatal multivitamin Tabs tablet Take 1 tablet daily at 12 noon by mouth.       Diet: routine diet  Activity: Advance as tolerated. Pelvic rest for 6 weeks.   Outpatient follow up:6 weeks Follow up Appt: Future Appointments  Date Time Provider Department Center  04/02/2017 10:20 AM Marylene LandKooistra, Kathryn Lorraine, CNM [facility]   Follow up Visit:No Follow-up on file.  Postpartum contraception: Undecided  Newborn Data: Live born female  Birth Weight: 5 lb 9.9 oz (2549 g) APGAR: 8, 9  Newborn Delivery   Birth date/time:  03/05/2017 01:00:00 Delivery type:  Vaginal, Spontaneous     Baby Feeding: Breast Disposition:home  with mother   03/07/2017 Marylene LandKathryn Lorraine Kooistra, CNM

## 2017-03-08 ENCOUNTER — Ambulatory Visit: Payer: Self-pay

## 2017-03-08 NOTE — Lactation Note (Signed)
This note was copied from a baby's chart. Lactation Consultation Note: [facility] She has an appt with [facility] in Dec. She is aware pump needs to be returned in 2 weeks. Mother reports that she turned the pump up to high and now she has sore nipples. Observed tiny positional strips bilaterally. Mother was given coconut oil by staff nurse.  Mother plans to continue to post pump every 2-2 hours. She reports that she just pumped 30 ml. Mother plans to latch infant back on the breast when nipples heal. Mother taught off sided latch and advised mother to breast feed infant with feeding cue and at least 8-12 times in 24 hours. Discussed treatment and prevention of engorgement. Mother is aware of available LC services and community support.    Patient Name: Carla Nichols [id number]'s Date: 03/08/2017 Reason for consult: Follow-up assessment   Maternal Data    Feeding Nipple Type: Slow - flow  LATCH Score                   Interventions    Lactation Tools Discussed/Used     Consult Status Consult Status: Complete    Michel BickersKendrick, Kent Riendeau McCoy 03/08/2017, 12:25 PM

## 2017-04-02 ENCOUNTER — Ambulatory Visit: Payer: Medicaid Other | Admitting: Student

## 2017-04-27 ENCOUNTER — Encounter: Payer: Self-pay | Admitting: Advanced Practice Midwife

## 2017-04-27 ENCOUNTER — Ambulatory Visit (INDEPENDENT_AMBULATORY_CARE_PROVIDER_SITE_OTHER): Payer: Medicaid Other | Admitting: Advanced Practice Midwife

## 2017-04-27 ENCOUNTER — Ambulatory Visit (INDEPENDENT_AMBULATORY_CARE_PROVIDER_SITE_OTHER): Payer: Self-pay | Admitting: Clinical

## 2017-04-27 VITALS — BP 145/80 | HR 72 | Wt 308.0 lb

## 2017-04-27 DIAGNOSIS — Z30430 Encounter for insertion of intrauterine contraceptive device: Secondary | ICD-10-CM

## 2017-04-27 DIAGNOSIS — Z3202 Encounter for pregnancy test, result negative: Secondary | ICD-10-CM | POA: Diagnosis not present

## 2017-04-27 DIAGNOSIS — Z1389 Encounter for screening for other disorder: Secondary | ICD-10-CM

## 2017-04-27 DIAGNOSIS — F4323 Adjustment disorder with mixed anxiety and depressed mood: Secondary | ICD-10-CM

## 2017-04-27 DIAGNOSIS — Z30014 Encounter for initial prescription of intrauterine contraceptive device: Secondary | ICD-10-CM

## 2017-04-27 LAB — POCT PREGNANCY, URINE: Preg Test, Ur: NEGATIVE

## 2017-04-27 MED ORDER — LEVONORGESTREL 19.5 MCG/DAY IU IUD
INTRAUTERINE_SYSTEM | Freq: Once | INTRAUTERINE | Status: AC
Start: 2017-04-27 — End: 2017-04-27
  Administered 2017-04-27: 11:00:00 via INTRAUTERINE

## 2017-04-27 MED ORDER — SERTRALINE HCL 50 MG PO TABS: 50.0000 mg | ORAL_TABLET | Freq: Every day | ORAL | 1 refills | Status: DC

## 2017-04-27 NOTE — BH Specialist Note (Signed)
Integrated Behavioral Health Initial Visit  MRN: [id number] Name: Carla Nichols  Number of Integrated Behavioral Health Clinician visits:: 1/6 Session Start time: 11:00   Session End time: 11:40 Total time: 40 minutes  Type of Service: Integrated Behavioral Health- Individual/Family Interpretor:No. Interpretor Name and Language: n/a   Warm Hand Off Completed.       SUBJECTIVE: Carla Nichols is a 27 y.o. female accompanied by newborn son Patient was referred by Thressa ShellerHeather Hogan, CNM for depression, anxiety. Patient reports the following symptoms/concerns: Pt states her primary concern today is Pt states her primary concern is stress, worry, and feelings of depression over paternity of son; pt says it helps to talk about how she is feeling. She used to listen to music, dance, and see her best friend regularly, and this is what helped her feel her best.  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 Reduce symptoms of: anxiety, depression and stress 2. Increase knowledge and/or ability of: healthy habits  3. Demonstrate ability to: Increase healthy adjustment to current life circumstances and Increase adequate support systems for patient/family  INTERVENTIONS: Interventions utilized: Solution-Focused Strategies, Psychoeducation and/or Health Education and Link to [facility]  Standardized Assessments completed: GAD-7 and PHQ 9  ASSESSMENT: Patient currently experiencing Adjustment disorder with mixed anxious and depressed  mood   Patient may benefit from psychoeducation and brief therapeutic interventions regarding coping with symptoms of anxiety and depression, along with referral to couple counseling.  PLAN: 1. Follow up with behavioral health clinician on : As needed 2. Behavioral recommendations:  -Listen to music and dance with baby every day -Consider planning a time to spend time with best friend once/week; consider talking with her on the phone daily, for at least 5 minutes -Consider either [facility] for couple counseling -Consider Mom Talk/Baby & Me classes for additional [facility] support at [facility] -Read educational materials regarding coping with symptoms of anxiety and depression  3. Referral(s): Integrated Art gallery managerBehavioral Health Services (In Clinic) and Community Resources:  Mom Talk/Baby and Me classes 4. "From scale of 1-10, how likely are you to follow plan?": 8  Rae LipsJamie C Homar Weinkauf, LCSW  Depression screen [facility] 2/9 04/27/2017 02/19/2017  Decreased Interest - 0  Down, Depressed, Hopeless 3 0  PHQ - 2 Score 3 0  Altered sleeping 2 3  Tired, decreased energy - 3  Change in appetite 0 1  Feeling bad or failure about yourself  3 0  Trouble concentrating 0 0  Moving slowly or fidgety/restless 2 0  Suicidal thoughts - 0  PHQ-9 Score 10 7   GAD 7 : Generalized Anxiety Score 04/27/2017 02/19/2017  Nervous, Anxious, on Edge 2 0  Control/stop worrying 3 0  Worry too much - different things 3 0  Trouble relaxing 3 0  Restless 0 0  Easily annoyed or irritable 3 0  Afraid - awful might happen - 0  Total GAD 7 Score - 0

## 2017-04-27 NOTE — Progress Notes (Signed)
Subjective:     Carla Nichols is a 27 y.o. female who presents for a postpartum visit. She is 6 weeks postpartum following a spontaneous vaginal delivery. I have fully reviewed the prenatal and intrapartum course. The delivery was at 39 gestational weeks. Outcome: spontaneous vaginal delivery. Anesthesia: epidural. Postpartum course has been complicated by depression. Baby's course has been uncomplicated. Baby is feeding by bottle - Gerber Gentle. Bleeding no bleeding. Bowel function is normal. Bladder function is normal. Patient is sexually active. Contraception method is IUD. Postpartum depression screening: positive.  The following portions of the patient's history were reviewed and updated as appropriate: allergies, current medications, past family history, past medical history, past social history, past surgical history and problem list. She states that she was started on Zoloft at the beginning of the pregnancy, and stopped this medication near the middle of the pregnancy. She states that she stopped it at that time because she was feeling better, and she was feeling really well until after the first week. At that time her depression symptoms returned. She states that she is feeling worried, and will have intrusive thoughts. This will lead to panic and anxiety. She also feels that she angers quickly. She does report thoughts of hurting herself. She does not have a plan at this time. She states that sometimes she "feels so upset that she thinks it would be better off without her." She reports that she knows her baby needs her, and she would not do anything to harm herself even though the thoughts are there.   Review of Systems Pertinent items are noted in HPI.   Objective:    Wt (!) 308 lb (139.7 kg)   LMP 06/04/2016 (LMP Unknown)   BMI 54.56 kg/m   General:  alert and cooperative   Breasts:  inspection negative, no nipple discharge or bleeding, no masses or nodularity palpable  Lungs:  clear to auscultation bilaterally  Heart:  regular rate and rhythm, S1, S2 normal, no murmur, click, rub or gallop  Abdomen: soft, non-tender; bowel sounds normal; no masses,  no organomegaly   Vulva:  normal  Vagina: normal vagina  Cervix:  no lesions  Corpus: normal  Adnexa:  normal adnexa  Rectal Exam: Normal rectovaginal exam        GYNECOLOGY OFFICE PROCEDURE NOTE  Carla Nichols is a 27 y.o. G2P1011 here for [place] IUD insertion. No GYN concerns.  Last pap smear was on 07/2016 and was normal.  IUD Insertion Procedure Note Patient identified, informed consent performed, consent signed.   Discussed risks of irregular bleeding, cramping, infection, malpositioning or misplacement of the IUD outside the uterus which may require further procedure such as laparoscopy. Time out was performed.  Urine pregnancy test negative.  Speculum placed in the vagina.  Cervix visualized.  Cleaned with Betadine x 2.  Grasped anteriorly with a single tooth tenaculum.  Uterus sounded to 8 cm.  Liletta IUD placed per manufacturer's recommendations.  Strings trimmed to 3 cm. Tenaculum was removed, good hemostasis noted.  Patient tolerated procedure well.   Patient was given post-procedure instructions.  She was advised to have backup contraception for one week.  Patient was also asked to check IUD strings periodically and follow up in 4 weeks for IUD check.   Assessment:    Normal postpartum exam. Pap smear not done at today's visit.   Plan:    1. Contraception: IUD 2. PP depression, will meet with Asher MuirJamie to contract for safety and then start  Zoloft.  3. Follow up in: 4 weeks for IUD or as needed.   RX: zoloft 50mg  tabs, start with 1/2 tab q day x one week, and then increase to 1 tabs q day. #30 with 1 RF

## 2017-04-28 ENCOUNTER — Encounter: Payer: Self-pay | Admitting: *Deleted

## 2017-08-26 ENCOUNTER — Inpatient Hospital Stay (HOSPITAL_COMMUNITY)
Admission: EM | Admit: 2017-08-26 | Discharge: 2017-08-28 | DRG: 194 | Disposition: A | Discharge status: Home / Self Care | Payer: Medicaid Other | Attending: Internal Medicine | Admitting: Internal Medicine

## 2017-08-26 ENCOUNTER — Other Ambulatory Visit: Payer: Self-pay

## 2017-08-26 ENCOUNTER — Emergency Department (HOSPITAL_COMMUNITY): Payer: Medicaid Other

## 2017-08-26 ENCOUNTER — Encounter (HOSPITAL_COMMUNITY): Payer: Self-pay

## 2017-08-26 DIAGNOSIS — Z87891 Personal history of nicotine dependence: Secondary | ICD-10-CM

## 2017-08-26 DIAGNOSIS — J4521 Mild intermittent asthma with (acute) exacerbation: Secondary | ICD-10-CM | POA: Diagnosis not present

## 2017-08-26 DIAGNOSIS — J189 Pneumonia, unspecified organism: Secondary | ICD-10-CM | POA: Diagnosis not present

## 2017-08-26 DIAGNOSIS — J180 Bronchopneumonia, unspecified organism: Principal | ICD-10-CM | POA: Diagnosis present

## 2017-08-26 DIAGNOSIS — B9789 Other viral agents as the cause of diseases classified elsewhere: Secondary | ICD-10-CM | POA: Diagnosis present

## 2017-08-26 DIAGNOSIS — Z91013 Allergy to seafood: Secondary | ICD-10-CM

## 2017-08-26 DIAGNOSIS — R079 Chest pain, unspecified: Secondary | ICD-10-CM

## 2017-08-26 DIAGNOSIS — J45901 Unspecified asthma with (acute) exacerbation: Secondary | ICD-10-CM | POA: Diagnosis present

## 2017-08-26 DIAGNOSIS — Z91030 Bee allergy status: Secondary | ICD-10-CM

## 2017-08-26 DIAGNOSIS — K5900 Constipation, unspecified: Secondary | ICD-10-CM | POA: Diagnosis present

## 2017-08-26 DIAGNOSIS — Z6841 Body Mass Index (BMI) 40.0 and over, adult: Secondary | ICD-10-CM

## 2017-08-26 DIAGNOSIS — I10 Essential (primary) hypertension: Secondary | ICD-10-CM | POA: Diagnosis present

## 2017-08-26 DIAGNOSIS — E119 Type 2 diabetes mellitus without complications: Secondary | ICD-10-CM | POA: Diagnosis present

## 2017-08-26 LAB — BASIC METABOLIC PANEL
Anion gap: 10 (ref 5–15)
BUN: 7 mg/dL (ref 6–20)
CALCIUM: 8.9 mg/dL (ref 8.9–10.3)
CO2: 24 mmol/L (ref 22–32)
CREATININE: 0.67 mg/dL (ref 0.44–1.00)
Chloride: 104 mmol/L (ref 101–111)
GFR calc Af Amer: >60 mL/min (ref >60)
Glucose, Bld: 111 mg/dL — ABNORMAL HIGH (ref 65–99)
Potassium: 3.5 mmol/L (ref 3.5–5.1)
Sodium: 138 mmol/L (ref 135–145)

## 2017-08-26 LAB — CBC WITH DIFFERENTIAL/PLATELET
BASOS ABS: 0 10*3/uL (ref 0.0–0.1)
BASOS PCT: 0 %
EOS PCT: 3 %
Eosinophils Absolute: 0.4 10*3/uL (ref 0.0–0.7)
HEMATOCRIT: 41.3 % (ref 36.0–46.0)
Hemoglobin: 13.9 g/dL (ref 12.0–15.0)
LYMPHS PCT: 12 %
Lymphs Abs: 1.5 10*3/uL (ref 0.7–4.0)
MCH: 29.7 pg (ref 26.0–34.0)
MCHC: 33.7 g/dL (ref 30.0–36.0)
MCV: 88.2 fL (ref 78.0–100.0)
MONO ABS: 1 10*3/uL (ref 0.1–1.0)
Monocytes Relative: 8 %
Neutro Abs: 9.8 10*3/uL — ABNORMAL HIGH (ref 1.7–7.7)
Neutrophils Relative %: 77 %
Platelets: 196 10*3/uL (ref 150–400)
RBC: 4.68 MIL/uL (ref 3.87–5.11)
RDW: 13.9 % (ref 11.5–15.5)
WBC: 12.7 10*3/uL — ABNORMAL HIGH (ref 4.0–10.5)

## 2017-08-26 LAB — POC URINE PREG, ED: Preg Test, Ur: NEGATIVE

## 2017-08-26 LAB — I-STAT BETA HCG BLOOD, ED (MC, WL, AP ONLY): I-stat hCG, quantitative: 6.4 m[IU]/mL — ABNORMAL HIGH (ref <5)

## 2017-08-26 LAB — PROCALCITONIN

## 2017-08-26 LAB — I-STAT TROPONIN, ED: TROPONIN I, POC: 0 ng/mL (ref 0.00–0.08)

## 2017-08-26 MED ORDER — METHYLPREDNISOLONE SODIUM SUCC 125 MG IJ SOLR
125.0000 mg | Freq: Once | INTRAMUSCULAR | Status: AC
  Administered 2017-08-26: 125 mg via INTRAVENOUS
  Filled 2017-08-26: qty 2

## 2017-08-26 MED ORDER — SODIUM CHLORIDE 0.9 % IV BOLUS
1000.0000 mL | Freq: Once | INTRAVENOUS | Status: AC
  Administered 2017-08-26: 1000 mL via INTRAVENOUS

## 2017-08-26 MED ORDER — PREDNISONE 50 MG PO TABS
60.0000 mg | ORAL_TABLET | Freq: Every day | ORAL | Status: DC
  Administered 2017-08-27 – 2017-08-28 (×2): 60 mg via ORAL
  Filled 2017-08-26 (×2): qty 1

## 2017-08-26 MED ORDER — ALBUTEROL SULFATE (2.5 MG/3ML) 0.083% IN NEBU
5.0000 mg | INHALATION_SOLUTION | RESPIRATORY_TRACT | Status: DC | PRN
  Administered 2017-08-26 – 2017-08-27 (×2): 5 mg via RESPIRATORY_TRACT
  Filled 2017-08-26 (×2): qty 6

## 2017-08-26 MED ORDER — OXYCODONE HCL 5 MG PO TABS: 5.0000 mg | ORAL_TABLET | ORAL | Status: DC | PRN

## 2017-08-26 MED ORDER — MAGNESIUM SULFATE 2 GM/50ML IV SOLN
2.0000 g | Freq: Once | INTRAVENOUS | Status: AC
  Administered 2017-08-26: 2 g via INTRAVENOUS
  Filled 2017-08-26: qty 50

## 2017-08-26 MED ORDER — KETOROLAC TROMETHAMINE 30 MG/ML IJ SOLN
30.0000 mg | Freq: Once | INTRAMUSCULAR | Status: AC
  Administered 2017-08-26: 30 mg via INTRAVENOUS
  Filled 2017-08-26: qty 1

## 2017-08-26 MED ORDER — ENOXAPARIN SODIUM 40 MG/0.4ML ~~LOC~~ SOLN
40.0000 mg | SUBCUTANEOUS | Status: DC
  Administered 2017-08-26 – 2017-08-27 (×2): 40 mg via SUBCUTANEOUS
  Filled 2017-08-26 (×2): qty 0.4

## 2017-08-26 MED ORDER — ALBUTEROL (5 MG/ML) CONTINUOUS INHALATION SOLN
10.0000 mg/h | INHALATION_SOLUTION | Freq: Once | RESPIRATORY_TRACT | Status: AC
  Administered 2017-08-26: 10 mg/h via RESPIRATORY_TRACT
  Filled 2017-08-26: qty 20

## 2017-08-26 MED ORDER — ACETAMINOPHEN 325 MG PO TABS: 650.0000 mg | ORAL_TABLET | Freq: Four times a day (QID) | ORAL | Status: DC | PRN

## 2017-08-26 MED ORDER — ALBUTEROL SULFATE (2.5 MG/3ML) 0.083% IN NEBU
5.0000 mg | INHALATION_SOLUTION | Freq: Once | RESPIRATORY_TRACT | Status: AC
  Administered 2017-08-26: 5 mg via RESPIRATORY_TRACT
  Filled 2017-08-26: qty 6

## 2017-08-26 MED ORDER — ACETAMINOPHEN 650 MG RE SUPP: 650.0000 mg | Freq: Four times a day (QID) | RECTAL | Status: DC | PRN

## 2017-08-26 MED ORDER — IOPAMIDOL (ISOVUE-370) INJECTION 76%
INTRAVENOUS | Status: AC
  Filled 2017-08-26: qty 100

## 2017-08-26 MED ORDER — IOPAMIDOL (ISOVUE-370) INJECTION 76%
100.0000 mL | Freq: Once | INTRAVENOUS | Status: AC | PRN
  Administered 2017-08-26: 100 mL via INTRAVENOUS

## 2017-08-26 MED ORDER — AZITHROMYCIN 500 MG PO TABS
500.0000 mg | ORAL_TABLET | ORAL | Status: DC
  Administered 2017-08-26 – 2017-08-28 (×3): 500 mg via ORAL
  Filled 2017-08-26: qty 1
  Filled 2017-08-26: qty 2
  Filled 2017-08-26: qty 1

## 2017-08-26 MED ORDER — SODIUM CHLORIDE 0.9 % IV SOLN
1.0000 g | INTRAVENOUS | Status: DC
  Administered 2017-08-26 – 2017-08-27 (×2): 1 g via INTRAVENOUS
  Filled 2017-08-26 (×3): qty 10

## 2017-08-26 MED ORDER — IPRATROPIUM-ALBUTEROL 0.5-2.5 (3) MG/3ML IN SOLN
3.0000 mL | Freq: Once | RESPIRATORY_TRACT | Status: AC
  Administered 2017-08-26: 3 mL via RESPIRATORY_TRACT
  Filled 2017-08-26: qty 3

## 2017-08-26 MED ORDER — ALBUTEROL SULFATE (2.5 MG/3ML) 0.083% IN NEBU: 5.0000 mg | INHALATION_SOLUTION | RESPIRATORY_TRACT | Status: DC

## 2017-08-26 MED ORDER — ALBUTEROL (5 MG/ML) CONTINUOUS INHALATION SOLN
2.5000 mg/h | INHALATION_SOLUTION | RESPIRATORY_TRACT | Status: DC
  Administered 2017-08-26: 2.5 mg/h via RESPIRATORY_TRACT
  Filled 2017-08-26: qty 20

## 2017-08-26 NOTE — ED Notes (Signed)
ED Provider at bedside. 

## 2017-08-26 NOTE — ED Notes (Signed)
Pt transported to [facility] via stretcher per EDT; resp even, non-labored, able to speak in complete sentences; o2 sat WNL; first liter NS infusing wide open per gravity into PIV to left upper arm

## 2017-08-26 NOTE — ED Notes (Signed)
Pt reports she is feeling tight and as though she needs a breathing treatment.

## 2017-08-26 NOTE — ED Notes (Signed)
Pt had a baby sitting issue and was wanting to leave to get her baby. Spoke with pt and encouraged her to find another Arts administrator. Pt was able to get a Arts administrator and will be spending the night.

## 2017-08-26 NOTE — ED Provider Notes (Signed)
[facility] EMERGENCY DEPARTMENT Provider Note   CSN: [id number] Arrival date & time: 08/26/17  0458     History   Chief Complaint Chief Complaint  Patient presents with  . Shortness of Breath    HPI Carla Nichols is a 27 y.o. female with PMH/o Asthma, DM who presents for evaluation of shortness of breath that worsened yesterday.  Patient reports that over the last 3 days, she has been sick with nasal congestion, cough, rhinorrhea.  She reports cough is productive of yellow sputum.  Patient reports that she started getting shortness of breath and wheezing over the last day.  She reports she does use her albuterol inhaler nebulizer treatment at home with minimal improvement.  She reports she does have a history of having to be admitted for her asthma.  She states she has not had to been intubated.  She reports some chest tightness associated with her wheezing.  She reports subjective fever chills but not has not measured temperature.  Patient denies any abdominal pain, vomiting.  Patient reports she is a former smoker.  Denies any cardiac issues.  The history is provided by the patient.    Past Medical History:  Diagnosis Date  . Asthma    pulmicort and albuterol inhaler as needed  . Diabetes mellitus without complication (HCC)    type 2 diet controlled  . Dyspnea   . Hypertension    no meds  . Seizures (HCC)    as a child    Patient Active Problem List   Diagnosis Date Noted  . Asthma 08/26/2017    Past Surgical History:  Procedure Laterality Date  . DILATION AND EVACUATION N/A 01/24/2016   Procedure: DILATATION AND EVACUATION;  Surgeon: Nadara Mustard, MD;  Location: [facility] ORS;  Service: Gynecology;  Laterality: N/A;  . TONSILLECTOMY    . WISDOM TOOTH EXTRACTION       OB History    Gravida  2   Para  1   Term  1   Preterm  0   AB  1   Living  1     SAB  0   TAB  0   Ectopic  0   Multiple  0   Live Births  1             Home Medications    Prior to Admission medications   Medication Sig Start Date End Date Taking? Authorizing Provider  albuterol (PROVENTIL HFA;VENTOLIN HFA) 108 (90 Base) MCG/ACT inhaler Inhale 2 puffs into the lungs every 6 (six) hours as needed for wheezing.    Yes [provider]  albuterol (PROVENTIL) (2.5 MG/3ML) 0.083% nebulizer solution Take 3 mLs (2.5 mg total) by nebulization every 4 (four) hours as needed for wheezing or shortness of breath. 10/31/16  Yes Pricilla Loveless, MD  sertraline (ZOLOFT) 50 MG tablet Take 1 tablet (50 mg total) by mouth daily. Start with 1/2 tab q day x 1 week, and then increase to 1 tab q day Patient not taking: Reported on 08/26/2017 04/27/17   Armando Reichert, CNM    Family History Family History  Problem Relation Age of Onset  . Diabetes Mother   . Arthritis Mother   . Hypertension Father   . Arthritis Father   . Diabetes Maternal Grandmother   . Diabetes Paternal Grandmother   . Hypertension Paternal Grandmother     Social History Social History   Tobacco Use  . Smoking status: Former Smoker  Last attempt to quit: 12/21/2015    Years since quitting: 1.6  . Smokeless tobacco: Never Used  Substance Use Topics  . Alcohol use: No  . Drug use: No     Allergies   Bee venom and Shellfish allergy   Review of Systems Review of Systems  Constitutional: Positive for chills and fever (subjective).  HENT: Positive for congestion and rhinorrhea.   Eyes: Negative for visual disturbance.  Respiratory: Positive for cough, chest tightness, shortness of breath and wheezing.   Cardiovascular: Negative for chest pain.  Gastrointestinal: Negative for abdominal pain, diarrhea, nausea and vomiting.  Genitourinary: Negative for dysuria and hematuria.  Musculoskeletal: Negative for back pain and neck pain.  Neurological: Negative for dizziness, weakness, numbness and headaches.  All other systems reviewed and are negative.    Physical  Exam Updated Vital Signs BP (!) 149/116   Pulse (!) 111   Temp 99.4 F (37.4 C) (Oral)   Resp (!) 25   Ht  (1.6 m)   Wt (!) 142.4 kg (314 lb)   LMP 08/26/2017   SpO2 94%   BMI 55.62 kg/m   Physical Exam  Constitutional: She is oriented to person, place, and time. She appears well-developed and well-nourished.  HENT:  Head: Normocephalic and atraumatic.  Nose: Mucosal edema present.  Mouth/Throat: Oropharynx is clear and moist and mucous membranes are normal.  Eyes: Pupils are equal, round, and reactive to light. Conjunctivae, EOM and lids are normal.  Neck: Full passive range of motion without pain.  Cardiovascular: Regular rhythm, normal heart sounds and normal pulses. Tachycardia present. Exam reveals no gallop and no friction rub.  No murmur heard. Pulses:      Radial pulses are 2+ on the right side, and 2+ on the left side.       Dorsalis pedis pulses are 2+ on the right side, and 2+ on the left side.  Pulmonary/Chest: Effort normal. She has wheezes.  Speaking in short sentences.  Audible wheezing noted throughout.  Diffuse wheezing noted throughout lung fields.  Abdominal: Soft. Normal appearance. There is no tenderness. There is no rigidity and no guarding.  Musculoskeletal: Normal range of motion.  Bilateral lower extremities are symmetric in appearance  Neurological: She is alert and oriented to person, place, and time.  Skin: Skin is warm and dry. Capillary refill takes less than 2 seconds.  Psychiatric: She has a normal mood and affect. Her speech is normal.  Nursing note and vitals reviewed.    ED Treatments / Results  Labs (all labs ordered are listed, but only abnormal results are displayed) Labs Reviewed  BASIC METABOLIC PANEL - Abnormal; Notable for the following components:      Result Value   Glucose, Bld 111 (*)    All other components within normal limits  CBC WITH DIFFERENTIAL/PLATELET - Abnormal; Notable for the following components:   WBC  12.7 (*)    Neutro Abs 9.8 (*)    All other components within normal limits  I-STAT BETA HCG BLOOD, ED ([facility], [facility], [facility] ONLY) - Abnormal; Notable for the following components:   I-stat hCG, quantitative 6.4 (*)    All other components within normal limits  I-STAT TROPONIN, ED  POC URINE PREG, ED    EKG EKG Interpretation  Date/Time:  Wednesday Aug 26 2017 06:35:18 EDT Ventricular Rate:  100 PR Interval:    QRS Duration: 79 QT Interval:  318 QTC Calculation: 411 R Axis:   66 Text Interpretation:  Sinus tachycardia  Non-specific ST-t changes resolution of ST changes in v2/3 Confirmed by Raeford Razor [phone]) on 08/26/2017 7:08:07 AM   Radiology Dg Chest 2 View  Result Date: 08/26/2017 CLINICAL DATA:  Cough, history of asthma, diabetes, former smoker. EXAM: CHEST - 2 VIEW COMPARISON:  None in PACs FINDINGS: The lungs are adequately inflated. There is no focal infiltrate. There is no pleural effusion. The heart and pulmonary vascularity are normal. The mediastinum is normal in width. The trachea is midline. The bony thorax exhibits no acute abnormality. IMPRESSION: There is no active cardiopulmonary disease. Electronically Signed   By: David  [place] M.D.   On: 08/26/2017 07:10   Ct Angio Chest Pe W And/or Wo Contrast  Result Date: 08/26/2017 CLINICAL DATA:  27 year old female with cough, shortness of breath and left upper chest pain for 3 days. EXAM: CT ANGIOGRAPHY CHEST WITH CONTRAST TECHNIQUE: Multidetector CT imaging of the chest was performed using the standard protocol during bolus administration of intravenous contrast. Multiplanar CT image reconstructions and MIPs were obtained to evaluate the vascular anatomy. CONTRAST:  ISOVUE-370 IOPAMIDOL (ISOVUE-370) INJECTION 76% COMPARISON:  Chest CTA 08/31/2013 FINDINGS: Cardiovascular: Suboptimal contrast bolus timing in the pulmonary arterial tree. No central or hilar pulmonary embolus. No lobar or proximal segmental pulmonary artery filling  defect is identified. Lack of contrast and respiratory motion degrades the more distal pulmonary artery detail. Stable borderline to mild cardiomegaly. No pericardial effusion. Negative visible aorta. Mediastinum/Nodes: Negative. No mediastinal lymphadenopathy. Small hilar lymph nodes appears stable. Lungs/Pleura: Multifocal confluent left upper lobe ground-glass opacity is new since the prior CTA. There are several smaller scattered additional peripheral areas of ground-glass in both upper lobes. The major airways are patent. Improved lung volumes compared to 2015. The entire costophrenic angles are not included. But no consolidation, atelectasis, or pleural effusion is identified. Upper Abdomen: Negative visible upper abdominal viscera. Musculoskeletal: Age advanced lower thoracic disc and endplate degeneration with endplate spurring. No acute osseous abnormality identified. Review of the MIP images confirms the above findings. IMPRESSION: 1. No central or proximal pulmonary embolus. The bilateral distal pulmonary artery detail is obscured by suboptimal contrast bolus and respiratory motion. 2. Multifocal left upper lobe peribronchial ground-glass opacity is most suggestive of Acute Bronchopneumonia. Possible early infectious involvement in the right upper lobe. No pleural effusion is evident. Electronically Signed   By: Odessa Fleming M.D.   On: 08/26/2017 13:59    Procedures Procedures (including critical care time)  Medications Ordered in ED Medications  albuterol (PROVENTIL,VENTOLIN) solution continuous neb (2.5 mg/hr Nebulization Not Given 08/26/17 0856)  iopamidol (ISOVUE-370) 76 % injection (has no administration in time range)  sodium chloride 0.9 % bolus 1,000 mL (has no administration in time range)  ketorolac (TORADOL) 30 MG/ML injection 30 mg (has no administration in time range)  albuterol (PROVENTIL) (2.5 MG/3ML) 0.083% nebulizer solution 5 mg (5 mg Nebulization Given 08/26/17 0706)  sodium  chloride 0.9 % bolus 1,000 mL (0 mLs Intravenous Stopped 08/26/17 0756)  methylPREDNISolone sodium succinate (SOLU-MEDROL) 125 mg/2 mL injection 125 mg (125 mg Intravenous Given 08/26/17 0715)  ipratropium-albuterol (DUONEB) 0.5-2.5 (3) MG/3ML nebulizer solution 3 mL (3 mLs Nebulization Given 08/26/17 0706)  sodium chloride 0.9 % bolus 1,000 mL (0 mLs Intravenous Stopped 08/26/17 1103)  albuterol (PROVENTIL,VENTOLIN) solution continuous neb (10 mg/hr Nebulization Given 08/26/17 0857)  magnesium sulfate IVPB 2 g 50 mL (0 g Intravenous Stopped 08/26/17 1103)  iopamidol (ISOVUE-370) 76 % injection 100 mL (100 mLs Intravenous Contrast Given 08/26/17 1239)  ipratropium-albuterol (DUONEB)  0.5-2.5 (3) MG/3ML nebulizer solution 3 mL (3 mLs Nebulization Given 08/26/17 1308)     Initial Impression / Assessment and Plan / ED Course  I have reviewed the triage vital signs and the nursing notes.  Pertinent labs & imaging results that were available during my care of the patient were reviewed by me and considered in my medical decision making (see chart for details).     27 year old female who presents for evaluation of shortness of breath.  Reports being sick with cough, congestion, rhinorrhea for the last few days.  Reports shortness of breath that worsened over the last 24 hours.  Reports using nebulizer and albuterol treatments at home with minimal improvement.  Reports some anterior chest tightness that began today.  No nausea, vomiting, bilateral lower extremity edema.  On initial ED arrival, patient is tachycardic, afebrile, tachypneic.  Patient is requiring 2 L of oxygen to maintain 97% room air.  She has no oxygen requirement at home.  Diffuse wheezing throughout all lung fields.  She is speaking in very short sentences.  Consider infectious etiology versus asthma exacerbation versus ACS etiology.  Will plan for nebulizer treatment, steroids, chest x-ray, basic labs.  BMP is unremarkable.  I-STAT beta is 6.4.  Likely  a false positive.  CBC shows leukocytosis of 12.7.  Otherwise unremarkable.  Troponin negative.  Urine pregnancy negative.  Chest x-ray negative for any acute pneumonia.  Patient has a HEART score of 2.  Reevaluation after nebulizer treatment, patient still with diffuse wheezing.  Will plan for continuous nebulizer.  Reevaluation after continuous neb, patient still with tachycardia and requiring oxygen.  Will plan for CTA for evaluation of PE.  Meanwhile, will give magnesium.  On return to room from CT, patient diaphoretic, tachycardic.  She attempted to ambulate to the bathroom and had a desat down to 88%.  She is still requiring 2 L of oxygen which is more than her baseline.  Repeat nebulizer started.  At this time, concern for asthma exacerbation that will likely require admission.  CT reviewed.  Negative for any acute pulmonary emboli.  They do mention that the other segments cannot be completely visualized.  Patient has multiple groundglass opacities concerning for bronchopneumonia.  Discussed with hospitalist.  Will plan to admit.  Final Clinical Impressions(s) / ED Diagnoses   Final diagnoses:  Exacerbation of asthma, unspecified asthma severity, unspecified whether persistent  Chest pain, unspecified type    ED Discharge Orders    None       Maxwell Caul, PA-C 08/26/17 1409    Shon Baton, MD 09/01/17 [phone]

## 2017-08-26 NOTE — ED Notes (Signed)
Report taken from Rushie Goltz, RN - care assumed at this time; resting quietly on stretcher - breathing tx in progress - no complaints at this time

## 2017-08-26 NOTE — ED Notes (Signed)
Radiology staff notified of neg preg and patent IV access

## 2017-08-26 NOTE — ED Notes (Signed)
Patient ambulated in the hallway with no complaints of dizziness.  Patients 02saturation while ambulating remained between 92% and 95%.

## 2017-08-26 NOTE — ED Notes (Signed)
Patient transported to X-ray 

## 2017-08-26 NOTE — ED Notes (Signed)
Patient transported to CT per rad tech

## 2017-08-26 NOTE — ED Notes (Signed)
Paged admitting for [place], RN.  Patient states she is better and wanting to leave

## 2017-08-26 NOTE — ED Notes (Signed)
Report given to 5C RN 

## 2017-08-26 NOTE — H&P (Addendum)
History and Physical    Carla Nichols DOB: 1990/10/17 DOA: 08/26/2017  PCP: System, Pcp Not In Patient coming from: home  Chief Complaint: shortness of breath  HPI: Carla Nichols is a 27 y.o. female with medical history significant of asthma and diabetes  Presents to the ED complaining of shortness of breath.  Pt reports she has had a cough with nasal drainage and congestion for 3 days.  Pt reports she has been using her albuterol nebulizer and her inhaler at home without relief.  Pt reports he asthma always resolves with home treatments.  She has never been hospitalized for asthma.  She has not been intubated. Pt denies any chest pain.      ED Course: Pt was given duoneb x 2 and a 1 hour continuous neb.   Pt received solumedrol and magnesium.   Pt reports feeling better but still has some shortness of breath compared to her normal.  Pt experienced shortness of breath with ambulation.  Pt had a ct scan which shows left upper lobe pneumonia.   Review of Systems: Review of Systems Review of Systems  Constitutional: Negative for chills and fever.  HENT: Positive for congestion.   Eyes: Negative.   Respiratory: Positive for cough, shortness of breath and wheezing.   Cardiovascular: Negative for chest pain and leg swelling.  Gastrointestinal: Negative.  Negative for nausea.  Genitourinary: Negative.  Negative for dysuria.  Musculoskeletal: Negative.  Negative for myalgias.  Skin: Negative.   Neurological: Negative.  Negative for headaches.  Endo/Heme/Allergies: Does not bruise/bleed easily.  Psychiatric/Behavioral: Negative.    Ambulatory Status:Ambulatory  Past Medical History:  Diagnosis Date  . Asthma    pulmicort and albuterol inhaler as needed  . Diabetes mellitus without complication (HCC)    type 2 diet controlled  . Dyspnea   . Hypertension    no meds  . Seizures (HCC)    as a child    Past Surgical History:  Procedure Laterality Date  .  DILATION AND EVACUATION N/A 01/24/2016   Procedure: DILATATION AND EVACUATION;  Surgeon: Nadara Mustard, MD;  Location: [facility] ORS;  Service: Gynecology;  Laterality: N/A;  . TONSILLECTOMY    . WISDOM TOOTH EXTRACTION      Social History   Socioeconomic History  . Marital status: Single    Spouse name: Not on file  . Number of children: Not on file  . Years of education: Not on file  . Highest education level: Not on file  Occupational History  . Not on file  Social Needs  . Financial resource strain: Not on file  . Food insecurity:    Worry: Not on file    Inability: Not on file  . Transportation needs:    Medical: Not on file    Non-medical: Not on file  Tobacco Use  . Smoking status: Former Smoker    Last attempt to quit: 12/21/2015    Years since quitting: 1.6  . Smokeless tobacco: Never Used  Substance and Sexual Activity  . Alcohol use: No  . Drug use: No  . Sexual activity: Yes    Birth control/protection: None  Lifestyle  . Physical activity:    Days per week: Not on file    Minutes per session: Not on file  . Stress: Not on file  Relationships  . Social connections:    Talks on phone: Not on file    Gets together: Not on file    Attends religious service:  Not on file    Active member of club or organization: Not on file    Attends meetings of clubs or organizations: Not on file    Relationship status: Not on file  . Intimate partner violence:    Fear of current or ex partner: Not on file    Emotionally abused: Not on file    Physically abused: Not on file    Forced sexual activity: Not on file  Other Topics Concern  . Not on file  Social History Narrative  . Not on file    Allergies  Allergen Reactions  . Bee Venom Swelling  . Shellfish Allergy Anaphylaxis    Betadine OK    Family History  Problem Relation Age of Onset  . Diabetes Mother   . Arthritis Mother   . Hypertension Father   . Arthritis Father   . Diabetes Maternal Grandmother   .  Diabetes Paternal Grandmother   . Hypertension Paternal Grandmother     Prior to Admission medications   Medication Sig Start Date End Date Taking? Authorizing Provider  albuterol (PROVENTIL HFA;VENTOLIN HFA) 108 (90 Base) MCG/ACT inhaler Inhale 2 puffs into the lungs every 6 (six) hours as needed for wheezing.    Yes [provider]  albuterol (PROVENTIL) (2.5 MG/3ML) 0.083% nebulizer solution Take 3 mLs (2.5 mg total) by nebulization every 4 (four) hours as needed for wheezing or shortness of breath. 10/31/16  Yes Pricilla Loveless, MD  sertraline (ZOLOFT) 50 MG tablet Take 1 tablet (50 mg total) by mouth daily. Start with 1/2 tab q day x 1 week, and then increase to 1 tab q day Patient not taking: Reported on 08/26/2017 04/27/17   Armando Reichert, CNM    Physical Exam: Vitals:   08/26/17 1146 08/26/17 1200 08/26/17 1215 08/26/17 1230  BP: (!) 114/55 105/67 (!) 87/63 (!) 149/116  Pulse:  (!) 116 (!) 115 (!) 111  Resp: (!) 24 (!) 29 (!) 25 (!) 25  Temp:      TempSrc:      SpO2: 91% 91% 92% 94%  Weight:      Height:         General:  Appears calm and comfortable Eyes: PERRL, EOMI, normal lids, iris ENT: grossly normal hearing, lips & tongue, mmm Neck: no LAD, masses or thyromegaly Cardiovascular: Tahycardia. No LE edema.  Respiratory: CTA bilaterally, no w/r/r. Normal respiratory effort. Abdomen: soft, ntnd, NABS Skin:  no rash or induration seen on limited exam Musculoskeletal: grossly normal tone BUE/BLE, good ROM, no bony abnormality Psychiatric: grossly normal mood and affect, speech fluent and appropriate, AOx3 Neurologic: CN 2-12 grossly intact, moves all extremities in coordinated fashion, sensation intact  Labs on Admission: I have personally reviewed following labs and imaging studies  CBC: Recent Labs  Lab 08/26/17 0640  WBC 12.7*  NEUTROABS 9.8*  HGB 13.9  HCT 41.3  MCV 88.2  PLT 196   Basic Metabolic Panel: Recent Labs  Lab 08/26/17 0640  NA 138    K 3.5  CL 104  CO2 24  GLUCOSE 111*  BUN 7  CREATININE 0.67  CALCIUM 8.9   GFR: Estimated Creatinine Clearance: 151.3 mL/min (by C-G formula based on SCr of 0.67 mg/dL). Liver Function Tests: No results for input(s): AST, ALT, ALKPHOS, BILITOT, PROT, ALBUMIN in the last 168 hours. No results for input(s): LIPASE, AMYLASE in the last 168 hours. No results for input(s): AMMONIA in the last 168 hours. Coagulation Profile: No results for input(s): INR,  PROTIME in the last 168 hours. Cardiac Enzymes: No results for input(s): CKTOTAL, CKMB, CKMBINDEX, TROPONINI in the last 168 hours. BNP (last 3 results) No results for input(s): PROBNP in the last 8760 hours. HbA1C: No results for input(s): HGBA1C in the last 72 hours. CBG: No results for input(s): GLUCAP in the last 168 hours. Lipid Profile: No results for input(s): CHOL, HDL, LDLCALC, TRIG, CHOLHDL, LDLDIRECT in the last 72 hours. Thyroid Function Tests: No results for input(s): TSH, T4TOTAL, FREET4, T3FREE, THYROIDAB in the last 72 hours. Anemia Panel: No results for input(s): VITAMINB12, FOLATE, FERRITIN, TIBC, IRON, RETICCTPCT in the last 72 hours. Urine analysis:    Component Value Date/Time   COLORURINE YELLOW 02/28/2017 0925   APPEARANCEUR CLEAR 02/28/2017 0925   APPEARANCEUR Clear 08/31/2013 0424   LABSPEC 1.011 02/28/2017 0925   LABSPEC 1.023 08/31/2013 0424   PHURINE 8.0 02/28/2017 0925   GLUCOSEU NEGATIVE 02/28/2017 0925   GLUCOSEU Negative 08/31/2013 0424   HGBUR NEGATIVE 02/28/2017 0925   BILIRUBINUR NEGATIVE 02/28/2017 0925   BILIRUBINUR Negative 08/31/2013 0424   KETONESUR NEGATIVE 02/28/2017 0925   PROTEINUR NEGATIVE 02/28/2017 0925   UROBILINOGEN 0.2 02/26/2017 1553   NITRITE NEGATIVE 02/28/2017 0925   LEUKOCYTESUR MODERATE (A) 02/28/2017 0925   LEUKOCYTESUR Negative 08/31/2013 0424    Creatinine Clearance: Estimated Creatinine Clearance: 151.3 mL/min (by C-G formula based on SCr of 0.67  mg/dL).  Sepsis Labs: (procalcitonin:4,lacticidven:4) )No results found for this or any previous visit (from the past 240 hour(s)).   Radiological Exams on Admission: Dg Chest 2 View  Result Date: 08/26/2017 CLINICAL DATA:  Cough, history of asthma, diabetes, former smoker. EXAM: CHEST - 2 VIEW COMPARISON:  None in PACs FINDINGS: The lungs are adequately inflated. There is no focal infiltrate. There is no pleural effusion. The heart and pulmonary vascularity are normal. The mediastinum is normal in width. The trachea is midline. The bony thorax exhibits no acute abnormality. IMPRESSION: There is no active cardiopulmonary disease. Electronically Signed   By: David  [place] M.D.   On: 08/26/2017 07:10   Ct Angio Chest Pe W And/or Wo Contrast  Result Date: 08/26/2017 CLINICAL DATA:  27 year old female with cough, shortness of breath and left upper chest pain for 3 days. EXAM: CT ANGIOGRAPHY CHEST WITH CONTRAST TECHNIQUE: Multidetector CT imaging of the chest was performed using the standard protocol during bolus administration of intravenous contrast. Multiplanar CT image reconstructions and MIPs were obtained to evaluate the vascular anatomy. CONTRAST:  ISOVUE-370 IOPAMIDOL (ISOVUE-370) INJECTION 76% COMPARISON:  Chest CTA 08/31/2013 FINDINGS: Cardiovascular: Suboptimal contrast bolus timing in the pulmonary arterial tree. No central or hilar pulmonary embolus. No lobar or proximal segmental pulmonary artery filling defect is identified. Lack of contrast and respiratory motion degrades the more distal pulmonary artery detail. Stable borderline to mild cardiomegaly. No pericardial effusion. Negative visible aorta. Mediastinum/Nodes: Negative. No mediastinal lymphadenopathy. Small hilar lymph nodes appears stable. Lungs/Pleura: Multifocal confluent left upper lobe ground-glass opacity is new since the prior CTA. There are several smaller scattered additional peripheral areas of ground-glass in  both upper lobes. The major airways are patent. Improved lung volumes compared to 2015. The entire costophrenic angles are not included. But no consolidation, atelectasis, or pleural effusion is identified. Upper Abdomen: Negative visible upper abdominal viscera. Musculoskeletal: Age advanced lower thoracic disc and endplate degeneration with endplate spurring. No acute osseous abnormality identified. Review of the MIP images confirms the above findings. IMPRESSION: 1. No central or proximal pulmonary embolus. The bilateral distal pulmonary artery  detail is obscured by suboptimal contrast bolus and respiratory motion. 2. Multifocal left upper lobe peribronchial ground-glass opacity is most suggestive of Acute Bronchopneumonia. Possible early infectious involvement in the right upper lobe. No pleural effusion is evident. Electronically Signed   By: Odessa Fleming M.D.   On: 08/26/2017 13:59    EKG: Independently reviewed.   Assessment/Plan  1) Community Acquired Pneumonia  Rocephin and Zithromax IV  2) Asthma Pt has received solumedrol and Mag sulfate Albuterol nebulization's  every 4 hours. Prednisone 60 mg in am    3) Diabetes Mellitus Type 2. Diet controlled    DVT prophylaxis: lovenox Code Status: full Family Communication:  Disposition Plan: Discharge in am if improved  Consults called:  Admission status: observation   Langston Masker PA-C [facility]  If 7PM-7AM, please contact night-coverage [email or web address] Password [email or web address]  08/26/2017, 2:02 PM

## 2017-08-26 NOTE — ED Triage Notes (Signed)
Pt reports that she has asthma, worse over the past 3 days, has been hospitalized in the past, audible wheezing noted, productive yellow phlegm. Unrelieved by home nebs

## 2017-08-27 DIAGNOSIS — J181 Lobar pneumonia, unspecified organism: Secondary | ICD-10-CM

## 2017-08-27 DIAGNOSIS — I10 Essential (primary) hypertension: Secondary | ICD-10-CM | POA: Diagnosis present

## 2017-08-27 DIAGNOSIS — E119 Type 2 diabetes mellitus without complications: Secondary | ICD-10-CM | POA: Diagnosis present

## 2017-08-27 DIAGNOSIS — Z91030 Bee allergy status: Secondary | ICD-10-CM | POA: Diagnosis not present

## 2017-08-27 DIAGNOSIS — J45901 Unspecified asthma with (acute) exacerbation: Secondary | ICD-10-CM | POA: Diagnosis not present

## 2017-08-27 DIAGNOSIS — Z91013 Allergy to seafood: Secondary | ICD-10-CM | POA: Diagnosis not present

## 2017-08-27 DIAGNOSIS — K5900 Constipation, unspecified: Secondary | ICD-10-CM | POA: Diagnosis present

## 2017-08-27 DIAGNOSIS — R0602 Shortness of breath: Secondary | ICD-10-CM | POA: Diagnosis present

## 2017-08-27 DIAGNOSIS — Z87891 Personal history of nicotine dependence: Secondary | ICD-10-CM | POA: Diagnosis not present

## 2017-08-27 DIAGNOSIS — B9789 Other viral agents as the cause of diseases classified elsewhere: Secondary | ICD-10-CM | POA: Diagnosis present

## 2017-08-27 DIAGNOSIS — J180 Bronchopneumonia, unspecified organism: Secondary | ICD-10-CM | POA: Diagnosis present

## 2017-08-27 DIAGNOSIS — Z6841 Body Mass Index (BMI) 40.0 and over, adult: Secondary | ICD-10-CM | POA: Diagnosis not present

## 2017-08-27 LAB — RESPIRATORY PANEL BY PCR
ADENOVIRUS-RVPPCR: NOT DETECTED
Bordetella pertussis: NOT DETECTED
CORONAVIRUS 229E-RVPPCR: NOT DETECTED
CORONAVIRUS OC43-RVPPCR: NOT DETECTED
Chlamydophila pneumoniae: NOT DETECTED
Coronavirus HKU1: NOT DETECTED
Coronavirus NL63: NOT DETECTED
Influenza A: NOT DETECTED
Influenza B: NOT DETECTED
MYCOPLASMA PNEUMONIAE-RVPPCR: NOT DETECTED
Metapneumovirus: NOT DETECTED
PARAINFLUENZA VIRUS 1-RVPPCR: NOT DETECTED
Parainfluenza Virus 2: NOT DETECTED
Parainfluenza Virus 3: NOT DETECTED
Parainfluenza Virus 4: NOT DETECTED
RESPIRATORY SYNCYTIAL VIRUS-RVPPCR: NOT DETECTED
Rhinovirus / Enterovirus: DETECTED — AB

## 2017-08-27 LAB — CBC
HCT: 40.8 % (ref 36.0–46.0)
Hemoglobin: 13.4 g/dL (ref 12.0–15.0)
MCH: 29.1 pg (ref 26.0–34.0)
MCHC: 32.8 g/dL (ref 30.0–36.0)
MCV: 88.5 fL (ref 78.0–100.0)
PLATELETS: 198 10*3/uL (ref 150–400)
RBC: 4.61 MIL/uL (ref 3.87–5.11)
RDW: 14.3 % (ref 11.5–15.5)
WBC: 15.2 10*3/uL — ABNORMAL HIGH (ref 4.0–10.5)

## 2017-08-27 LAB — BASIC METABOLIC PANEL
Anion gap: 8 (ref 5–15)
BUN: 5 mg/dL — AB (ref 6–20)
CALCIUM: 8.9 mg/dL (ref 8.9–10.3)
CO2: 22 mmol/L (ref 22–32)
CREATININE: 0.53 mg/dL (ref 0.44–1.00)
Chloride: 110 mmol/L (ref 101–111)
GFR calc non Af Amer: >60 mL/min (ref >60)
Glucose, Bld: 105 mg/dL — ABNORMAL HIGH (ref 65–99)
POTASSIUM: 4.1 mmol/L (ref 3.5–5.1)
Sodium: 140 mmol/L (ref 135–145)

## 2017-08-27 LAB — HIV ANTIBODY (ROUTINE TESTING W REFLEX): HIV SCREEN 4TH GENERATION: NONREACTIVE

## 2017-08-27 MED ORDER — GUAIFENESIN ER 600 MG PO TB12
600.0000 mg | ORAL_TABLET | Freq: Two times a day (BID) | ORAL | Status: DC
  Administered 2017-08-27 – 2017-08-28 (×3): 600 mg via ORAL
  Filled 2017-08-27 (×3): qty 1

## 2017-08-27 MED ORDER — ALBUTEROL SULFATE (2.5 MG/3ML) 0.083% IN NEBU: 2.5000 mg | INHALATION_SOLUTION | Freq: Four times a day (QID) | RESPIRATORY_TRACT | Status: DC

## 2017-08-27 MED ORDER — IPRATROPIUM-ALBUTEROL 0.5-2.5 (3) MG/3ML IN SOLN
3.0000 mL | Freq: Two times a day (BID) | RESPIRATORY_TRACT | Status: DC
  Administered 2017-08-27 – 2017-08-28 (×2): 3 mL via RESPIRATORY_TRACT
  Filled 2017-08-27 (×3): qty 3

## 2017-08-27 MED ORDER — BISACODYL 5 MG PO TBEC
10.0000 mg | DELAYED_RELEASE_TABLET | Freq: Every day | ORAL | Status: DC
  Administered 2017-08-27: 10 mg via ORAL
  Filled 2017-08-27 (×2): qty 2

## 2017-08-27 NOTE — Care Management Note (Addendum)
Case Management Note  Patient Details  Name: Carla Nichols MRN: [id number] Date of Birth: January 22, 1991  Subjective/Objective:       History of Asthma; Admitted for asthma exacerbation and left upper lobe Pneumonia. no pcp noted.  Action/Plan: NCM suggested Medicaid Provider accepting new patients see patient AVS for additional information if needed.  Prior to admission patient lived at home. At discharge plans to return to same living situation.  Home DME: nebulizer machine.  NCM will continue to follow for discharge planning needs.  Expected Discharge Date:  To Be determined.               Expected Discharge Plan:  Home/Self Care  In-House Referral:   N/A  Discharge planning Services  CM Consult  Status of Service:  In process, will continue to follow  Yancey Flemings, RN 08/27/2017, 1:09 PM

## 2017-08-27 NOTE — Progress Notes (Signed)
Chaplain NOte:  Request to see patient for support as patient was struggling with not being able to be with her 28 month old son due to her hospitalization.  Pt. Reported feeling better and being able to breathe better today. She talked about how hard it is for her to not be able to be with her son, Carla Nichols, and how much she misses him and worries about him. She indicated that he is being taking care of by his Aunt and she knows he is being cared for, but wants to get well enough to go home and take care of him herself. Her hope is to go home tomorrow. She became a little tearful as she talked about missing him but then indicated she knows stress can impact her condition and is trying to stay positive.   She asked me to pray for her and her son, which I did.   Will follow up with her tomorrow.   Dyke Maes [phone]

## 2017-08-27 NOTE — Progress Notes (Signed)
PROGRESS NOTE    Carla Nichols  ZOX:[id number] DOB: 1990/12/31 DOA: 08/26/2017 PCP: System, Pcp Not In   Brief Narrative: 27 y.o. female with medical history significant of asthma and diabetes  Presents to the ED complaining of shortness of breath.  Pt reports she has had a cough with nasal drainage and congestion for 3 days.  Pt reports she has been using her albuterol nebulizer and her inhaler at home without relief.  Pt reports he asthma always resolves with home treatments.  She has never been hospitalized for asthma.  She has not been intubated. Pt denies any chest pain.      ED Course: Pt was given duoneb x 2 and a 1 hour continuous neb.   Pt received solumedrol and magnesium.   Pt reports feeling better but still has some shortness of breath compared to her normal.  Pt experienced shortness of breath with ambulation.  Pt had a ct scan which shows left upper lobe pneumonia.    Assessment & Plan:   Active Problems:   Asthma   Pneumonia  1] asthma exacerbation-patient reports that this is the first time she has had an asthma exacerbation.  This is probably secondary to pneumonia.  Chest x-ray was clear.  CT angiogram of the chest showed no evidence of pulmonary embolism.  But she had multifocal left upper lobe peribronchial groundglass opacity consistent with acute bronchopneumonia.  There was no pleural effusion.  Possible early infectious involvement of the right upper lobe.  On Rocephin and azithromycin.  Will start nebulizer treatments with albuterol.  Patient complains that her phlegm is to take and not able to bring it up add Mucinex.  Continue p.o. steroids.  RSV panel is positive for rhinovirus which will be treated symptomatically.  2] history of diabetes diet controlled at home.  3] constipation stool softener.   DVT prophylaxis: Lovenox Code Status: Full code Family Communication: No family available Disposition Plan in 24 to 48 hours Consultants:   None Procedures: None Antimicrobials: Azithromycin and Rocephin Subjective: Feels a lot better compared to yesterday.  Objective: Vitals:   08/26/17 2322 08/27/17 0545 08/27/17 0930 08/27/17 1222  BP: 138/78 125/81 128/74   Pulse: 92 73 76   Resp: Temp: 98 F (36.7 C) 98.6 F (37 C) 98.1 F (36.7 C)   TempSrc:   Oral   SpO2: 97% 97% 93% 98%  Weight:      Height:        Intake/Output Summary (Last 24 hours) at 08/27/2017 1232 Last data filed at 08/27/2017 0545 Gross per 24 hour  Intake 1100 ml  Output 0 ml  Net 1100 ml   Filed Weights   08/26/17 1145  Weight: (!) 142.4 kg (314 lb)    Examination:  General exam: Appears calm and comfortable  Respiratory system: wheezing  to auscultation. Respiratory effort normal. Cardiovascular system: S1 & S2 heard, RRR. No JVD, murmurs, rubs, gallops or clicks. No pedal edema. Gastrointestinal system: Abdomen is nondistended, soft and nontender. No organomegaly or masses felt. Normal bowel sounds heard. Central nervous system: Alert and oriented. No focal neurological deficits. Extremities: Symmetric 5 x 5 power. Skin: No rashes, lesions or ulcers Psychiatry: Judgement and insight appear normal. Mood & affect appropriate.     Data Reviewed: I have personally reviewed following labs and imaging studies  CBC: Recent Labs  Lab 08/26/17 0640 08/27/17 0456  WBC 12.7* 15.2*  NEUTROABS 9.8*  --   HGB 13.9 13.4  HCT 41.3 40.8  MCV 88.2 88.5  PLT 196 198   Basic Metabolic Panel: Recent Labs  Lab 08/26/17 0640 08/27/17 0456  NA 138 140  K 3.5 4.1  CL 104 110  CO2 24 22  GLUCOSE 111* 105*  BUN 7 5*  CREATININE 0.67 0.53  CALCIUM 8.9 8.9   GFR: Estimated Creatinine Clearance: 151.3 mL/min (by C-G formula based on SCr of 0.53 mg/dL). Liver Function Tests: No results for input(s): AST, ALT, ALKPHOS, BILITOT, PROT, ALBUMIN in the last 168 hours. No results for input(s): LIPASE, AMYLASE in the last 168  hours. No results for input(s): AMMONIA in the last 168 hours. Coagulation Profile: No results for input(s): INR, PROTIME in the last 168 hours. Cardiac Enzymes: No results for input(s): CKTOTAL, CKMB, CKMBINDEX, TROPONINI in the last 168 hours. BNP (last 3 results) No results for input(s): PROBNP in the last 8760 hours. HbA1C: No results for input(s): HGBA1C in the last 72 hours. CBG: No results for input(s): GLUCAP in the last 168 hours. Lipid Profile: No results for input(s): CHOL, HDL, LDLCALC, TRIG, CHOLHDL, LDLDIRECT in the last 72 hours. Thyroid Function Tests: No results for input(s): TSH, T4TOTAL, FREET4, T3FREE, THYROIDAB in the last 72 hours. Anemia Panel: No results for input(s): VITAMINB12, FOLATE, FERRITIN, TIBC, IRON, RETICCTPCT in the last 72 hours. Sepsis Labs: Recent Labs  Lab 08/26/17 0640  PROCALCITON <0.10    Recent Results (from the past 240 hour(s))  Respiratory Panel by PCR     Status: Abnormal   Collection Time: 08/26/17  7:08 PM  Result Value Ref Range Status   Adenovirus NOT DETECTED NOT DETECTED Final   Coronavirus 229E NOT DETECTED NOT DETECTED Final   Coronavirus HKU1 NOT DETECTED NOT DETECTED Final   Coronavirus NL63 NOT DETECTED NOT DETECTED Final   Coronavirus OC43 NOT DETECTED NOT DETECTED Final   Metapneumovirus NOT DETECTED NOT DETECTED Final   Rhinovirus / Enterovirus DETECTED (A) NOT DETECTED Final   Influenza A NOT DETECTED NOT DETECTED Final   Influenza B NOT DETECTED NOT DETECTED Final   Parainfluenza Virus 1 NOT DETECTED NOT DETECTED Final   Parainfluenza Virus 2 NOT DETECTED NOT DETECTED Final   Parainfluenza Virus 3 NOT DETECTED NOT DETECTED Final   Parainfluenza Virus 4 NOT DETECTED NOT DETECTED Final   Respiratory Syncytial Virus NOT DETECTED NOT DETECTED Final   Bordetella pertussis NOT DETECTED NOT DETECTED Final   Chlamydophila pneumoniae NOT DETECTED NOT DETECTED Final   Mycoplasma pneumoniae NOT DETECTED NOT DETECTED  Final         Radiology Studies: Dg Chest 2 View  Result Date: 08/26/2017 CLINICAL DATA:  Cough, history of asthma, diabetes, former smoker. EXAM: CHEST - 2 VIEW COMPARISON:  None in PACs FINDINGS: The lungs are adequately inflated. There is no focal infiltrate. There is no pleural effusion. The heart and pulmonary vascularity are normal. The mediastinum is normal in width. The trachea is midline. The bony thorax exhibits no acute abnormality. IMPRESSION: There is no active cardiopulmonary disease. Electronically Signed   By: David  [place] M.D.   On: 08/26/2017 07:10   Ct Angio Chest Pe W And/or Wo Contrast  Result Date: 08/26/2017 CLINICAL DATA:  27 year old female with cough, shortness of breath and left upper chest pain for 3 days. EXAM: CT ANGIOGRAPHY CHEST WITH CONTRAST TECHNIQUE: Multidetector CT imaging of the chest was performed using the standard protocol during bolus administration of intravenous contrast. Multiplanar CT image reconstructions and MIPs were obtained to evaluate the vascular  anatomy. CONTRAST:  ISOVUE-370 IOPAMIDOL (ISOVUE-370) INJECTION 76% COMPARISON:  Chest CTA 08/31/2013 FINDINGS: Cardiovascular: Suboptimal contrast bolus timing in the pulmonary arterial tree. No central or hilar pulmonary embolus. No lobar or proximal segmental pulmonary artery filling defect is identified. Lack of contrast and respiratory motion degrades the more distal pulmonary artery detail. Stable borderline to mild cardiomegaly. No pericardial effusion. Negative visible aorta. Mediastinum/Nodes: Negative. No mediastinal lymphadenopathy. Small hilar lymph nodes appears stable. Lungs/Pleura: Multifocal confluent left upper lobe ground-glass opacity is new since the prior CTA. There are several smaller scattered additional peripheral areas of ground-glass in both upper lobes. The major airways are patent. Improved lung volumes compared to 2015. The entire costophrenic angles are not included. But no  consolidation, atelectasis, or pleural effusion is identified. Upper Abdomen: Negative visible upper abdominal viscera. Musculoskeletal: Age advanced lower thoracic disc and endplate degeneration with endplate spurring. No acute osseous abnormality identified. Review of the MIP images confirms the above findings. IMPRESSION: 1. No central or proximal pulmonary embolus. The bilateral distal pulmonary artery detail is obscured by suboptimal contrast bolus and respiratory motion. 2. Multifocal left upper lobe peribronchial ground-glass opacity is most suggestive of Acute Bronchopneumonia. Possible early infectious involvement in the right upper lobe. No pleural effusion is evident. Electronically Signed   By: Odessa Fleming M.D.   On: 08/26/2017 13:59        Scheduled Meds: . azithromycin  500 mg Oral Q24H  . bisacodyl  10 mg Oral Daily  . enoxaparin (LOVENOX) injection  40 mg Subcutaneous Q24H  . guaiFENesin  600 mg Oral BID  . ipratropium-albuterol  3 mL Nebulization BID  . predniSONE  60 mg Oral Q breakfast   Continuous Infusions: . cefTRIAXone (ROCEPHIN)  IV Stopped (08/26/17 1608)     LOS: 0 days     Alwyn Ren, MD [facility]  If 7PM-7AM, please contact night-coverage [email or web address] Password [facility] 08/27/2017, 12:32 PM

## 2017-08-28 LAB — BLOOD CULTURE ID PANEL (REFLEXED)
ACINETOBACTER BAUMANNII: NOT DETECTED
CANDIDA ALBICANS: NOT DETECTED
CANDIDA PARAPSILOSIS: NOT DETECTED
Candida glabrata: NOT DETECTED
Candida krusei: NOT DETECTED
Candida tropicalis: NOT DETECTED
ENTEROBACTERIACEAE SPECIES: NOT DETECTED
ESCHERICHIA COLI: NOT DETECTED
Enterobacter cloacae complex: NOT DETECTED
Enterococcus species: NOT DETECTED
HAEMOPHILUS INFLUENZAE: NOT DETECTED
Klebsiella oxytoca: NOT DETECTED
Klebsiella pneumoniae: NOT DETECTED
LISTERIA MONOCYTOGENES: NOT DETECTED
METHICILLIN RESISTANCE: DETECTED — AB
Neisseria meningitidis: NOT DETECTED
PSEUDOMONAS AERUGINOSA: NOT DETECTED
Proteus species: NOT DETECTED
SERRATIA MARCESCENS: NOT DETECTED
STAPHYLOCOCCUS AUREUS BCID: NOT DETECTED
STREPTOCOCCUS AGALACTIAE: NOT DETECTED
STREPTOCOCCUS PYOGENES: NOT DETECTED
STREPTOCOCCUS SPECIES: NOT DETECTED
Staphylococcus species: DETECTED — AB
Streptococcus pneumoniae: NOT DETECTED

## 2017-08-28 MED ORDER — PREDNISONE 10 MG (21) PO TBPK: ORAL_TABLET | ORAL | 0 refills | Status: DC

## 2017-08-28 MED ORDER — GUAIFENESIN ER 600 MG PO TB12: 600.0000 mg | ORAL_TABLET | Freq: Two times a day (BID) | ORAL | Status: DC

## 2017-08-28 MED ORDER — AMOXICILLIN-POT CLAVULANATE 875-125 MG PO TABS: 1.0000 | ORAL_TABLET | Freq: Two times a day (BID) | ORAL | 0 refills | Status: AC

## 2017-08-28 NOTE — Progress Notes (Signed)
PHARMACY - PHYSICIAN COMMUNICATION CRITICAL VALUE ALERT - BLOOD CULTURE IDENTIFICATION (BCID)  Nazanin Kinner is an 27 y.o. female who presented to [facility] on 08/26/2017 with a chief complaint of PNA   Name of physician (or Provider) Contacted: [facility] ([facility])  Current antibiotics: Ceftriaxone/Azithromycin   Changes to prescribed antibiotics recommended:  No changes for now, possible contaminant with only one bottle +, consider vancomycin if patient becomes febrile or has worsening imaging  Results for orders placed or performed during the hospital encounter of 08/26/17  Blood Culture ID Panel (Reflexed) (Collected: 08/26/2017  7:08 PM)  Result Value Ref Range   Enterococcus species NOT DETECTED NOT DETECTED   Listeria monocytogenes NOT DETECTED NOT DETECTED   Staphylococcus species DETECTED (A) NOT DETECTED   Staphylococcus aureus NOT DETECTED NOT DETECTED   Methicillin resistance DETECTED (A) NOT DETECTED   Streptococcus species NOT DETECTED NOT DETECTED   Streptococcus agalactiae NOT DETECTED NOT DETECTED   Streptococcus pneumoniae NOT DETECTED NOT DETECTED   Streptococcus pyogenes NOT DETECTED NOT DETECTED   Acinetobacter baumannii NOT DETECTED NOT DETECTED   Enterobacteriaceae species NOT DETECTED NOT DETECTED   Enterobacter cloacae complex NOT DETECTED NOT DETECTED   Escherichia coli NOT DETECTED NOT DETECTED   Klebsiella oxytoca NOT DETECTED NOT DETECTED   Klebsiella pneumoniae NOT DETECTED NOT DETECTED   Proteus species NOT DETECTED NOT DETECTED   Serratia marcescens NOT DETECTED NOT DETECTED   Haemophilus influenzae NOT DETECTED NOT DETECTED   Neisseria meningitidis NOT DETECTED NOT DETECTED   Pseudomonas aeruginosa NOT DETECTED NOT DETECTED   Candida albicans NOT DETECTED NOT DETECTED   Candida glabrata NOT DETECTED NOT DETECTED   Candida krusei NOT DETECTED NOT DETECTED   Candida parapsilosis NOT DETECTED NOT DETECTED   Candida tropicalis NOT DETECTED NOT  DETECTED    Abran Duke 08/28/2017  2:59 AM

## 2017-08-28 NOTE — Discharge Summary (Signed)
Physician Discharge Summary  Carla Nichols ZOX:[id number] DOB: 08-11-1990 DOA: 08/26/2017  PCP: System, Pcp Not In  Admit date: 08/26/2017 Discharge date: 08/28/2017  Admitted From: Home Disposition: Home Recommendations for Outpatient Follow-up:  1. Follow up with PCP in 1-2 weeks 2. Please obtain BMP/CBC in one week Home Health none Equipment/Devices none  Discharge Condition stable CODE STATUS full code Diet recommendation: Carb modified Brief/Interim Summary:27 y.o.femalewith medical history significant ofasthma and diabetes Presents to the ED complaining of shortness of breath. Pt reports she has had a cough with nasal drainage and congestion for 3 days. Pt reports she has been using her albuterol nebulizer and her inhaler at home without relief. Pt reports he asthma always resolves with home treatments. She has never been hospitalized for asthma. She has not been intubated. Pt denies any chest pain.     ED Course:Pt was given duoneb x 2 and a 1 hour continuous neb. Pt received solumedrol and magnesium. Pt reports feeling better but still has some shortness of breath compared to her normal. Pt experienced shortness of breath with ambulation. Pt had a ct scan which shows left upper lobe pneumonia.  08/28/2017 patient reports that she had a good night and she wants to go home.  She reports her child is sick and she wants to take the baby to the doctor.  She slept well.  Her breathing is better. Discharge Diagnoses:  Active Problems:   Exacerbation of asthma   Pneumonia 1] asthma exacerbation-patient reports that this is the first time she has had an asthma exacerbation.  This is probably secondary to pneumonia.  Chest x-ray was clear.  CT angiogram of the chest showed no evidence of pulmonary embolism.  But she had multifocal left upper lobe peribronchial groundglass opacity consistent with acute bronchopneumonia.  There was no pleural effusion.  Possible early  infectious involvement of the right upper lobe.  Patient will be discharged home on Augmentin.  Her blood culture came back 1 out of 2 MRSE thought to be contaminant.  Patient has nebulizer machine at home which will be continued.  Continue Mucinex.  Her RSV panel is also positive for rhinovirus. 2] history of diabetes diet controlled at home.  3] constipation stool softener.     Discharge Instructions  Discharge Instructions    Call MD for:  difficulty breathing, headache or visual disturbances   Complete by:  As directed    Call MD for:  persistant dizziness or light-headedness   Complete by:  As directed    Call MD for:  persistant nausea and vomiting   Complete by:  As directed    Call MD for:  severe uncontrolled pain   Complete by:  As directed    Diet - low sodium heart healthy   Complete by:  As directed    Increase activity slowly   Complete by:  As directed      Allergies as of 08/28/2017      Reactions   Bee Venom Swelling   Shellfish Allergy Anaphylaxis   Betadine OK      Medication List    STOP taking these medications   sertraline 50 MG tablet Commonly known as:  ZOLOFT     TAKE these medications   albuterol (2.5 MG/3ML) 0.083% nebulizer solution Commonly known as:  PROVENTIL Take 3 mLs (2.5 mg total) by nebulization every 4 (four) hours as needed for wheezing or shortness of breath. What changed:  Another medication with the same name was removed.  Continue taking this medication, and follow the directions you see here.   amoxicillin-clavulanate 875-125 MG tablet Commonly known as:  AUGMENTIN Take 1 tablet by mouth 2 (two) times daily for 10 days.   guaiFENesin 600 MG 12 hr tablet Commonly known as:  MUCINEX Take 1 tablet (600 mg total) by mouth 2 (two) times daily.   predniSONE 10 MG (21) Tbpk tablet Commonly known as:  STERAPRED UNI-PAK 21 TAB Take 3 tab for 3 days then 2 tab for 3 days then 1 tab for the rest of the days until done.       Follow-up Information    [facility] Follow up.   Why:  Can call to make appointment to establish Primary Care Provider if needed Contact information: [address] [place] [address] [phone]          Allergies  Allergen Reactions  . Bee Venom Swelling  . Shellfish Allergy Anaphylaxis    Betadine OK    Consultations: None Procedures/Studies: Dg Chest 2 View  Result Date: 08/26/2017 CLINICAL DATA:  Cough, history of asthma, diabetes, former smoker. EXAM: CHEST - 2 VIEW COMPARISON:  None in PACs FINDINGS: The lungs are adequately inflated. There is no focal infiltrate. There is no pleural effusion. The heart and pulmonary vascularity are normal. The mediastinum is normal in width. The trachea is midline. The bony thorax exhibits no acute abnormality. IMPRESSION: There is no active cardiopulmonary disease. Electronically Signed   By: David  [place] M.D.   On: 08/26/2017 07:10   Ct Angio Chest Pe W And/or Wo Contrast  Result Date: 08/26/2017 CLINICAL DATA:  27 year old female with cough, shortness of breath and left upper chest pain for 3 days. EXAM: CT ANGIOGRAPHY CHEST WITH CONTRAST TECHNIQUE: Multidetector CT imaging of the chest was performed using the standard protocol during bolus administration of intravenous contrast. Multiplanar CT image reconstructions and MIPs were obtained to evaluate the vascular anatomy. CONTRAST:  ISOVUE-370 IOPAMIDOL (ISOVUE-370) INJECTION 76% COMPARISON:  Chest CTA 08/31/2013 FINDINGS: Cardiovascular: Suboptimal contrast bolus timing in the pulmonary arterial tree. No central or hilar pulmonary embolus. No lobar or proximal segmental pulmonary artery filling defect is identified. Lack of contrast and respiratory motion degrades the more distal pulmonary artery detail. Stable borderline to mild cardiomegaly. No pericardial effusion. Negative visible aorta. Mediastinum/Nodes: Negative. No mediastinal lymphadenopathy.  Small hilar lymph nodes appears stable. Lungs/Pleura: Multifocal confluent left upper lobe ground-glass opacity is new since the prior CTA. There are several smaller scattered additional peripheral areas of ground-glass in both upper lobes. The major airways are patent. Improved lung volumes compared to 2015. The entire costophrenic angles are not included. But no consolidation, atelectasis, or pleural effusion is identified. Upper Abdomen: Negative visible upper abdominal viscera. Musculoskeletal: Age advanced lower thoracic disc and endplate degeneration with endplate spurring. No acute osseous abnormality identified. Review of the MIP images confirms the above findings. IMPRESSION: 1. No central or proximal pulmonary embolus. The bilateral distal pulmonary artery detail is obscured by suboptimal contrast bolus and respiratory motion. 2. Multifocal left upper lobe peribronchial ground-glass opacity is most suggestive of Acute Bronchopneumonia. Possible early infectious involvement in the right upper lobe. No pleural effusion is evident. Electronically Signed   By: Odessa Fleming M.D.   On: 08/26/2017 13:59    (Echo, Carotid, EGD, Colonoscopy, ERCP)    Subjective:   Discharge Exam: Vitals:   08/28/17 0443 08/28/17 0944  BP: (!) 144/85 (!) 138/98  Pulse: (!) 52 (!) 56  Resp: 16 18  Temp: 97.8 F (36.6 C) 98.2 F (36.8 C)  SpO2: 98% 98%   Vitals:   08/27/17 2040 08/27/17 2137 08/28/17 0443 08/28/17 0944  BP: (!) 143/88  (!) 144/85 (!) 138/98  Pulse: 62  (!) 52 (!) 56  Resp: Temp: 97.9 F (36.6 C)  97.8 F (36.6 C) 98.2 F (36.8 C)  TempSrc: Oral  Oral Oral  SpO2: 98% 99% 98% 98%  Weight:      Height:        General: Pt is alert, awake, not in acute distress Cardiovascular: RRR, S1/S2 +, no rubs, no gallops Respiratory: CTA bilaterally, no wheezing, no rhonchi Abdominal: Soft, NT, ND, bowel sounds + Extremities: no edema, no cyanosis    The results of significant  diagnostics from this hospitalization (including imaging, microbiology, ancillary and laboratory) are listed below for reference.     Microbiology: Recent Results (from the past 240 hour(s))  Culture, blood (routine x 2) Call MD if unable to obtain prior to antibiotics being given     Status: None (Preliminary result)   Collection Time: 08/26/17  7:08 PM  Result Value Ref Range Status   Specimen Description BLOOD LEFT ARM  Final   Special Requests   Final    BOTTLES DRAWN AEROBIC ONLY Blood Culture results may not be optimal due to an inadequate volume of blood received in culture bottles   Culture  Setup Time   Final    AEROBIC BOTTLE ONLY GRAM POSITIVE COCCI CRITICAL RESULT CALLED TO, READ BACK BY AND VERIFIED WITHShela Commons [facility] 08/28/17 0247 JDW Performed at [facility], [address], [place], [place] [address]    Culture GRAM POSITIVE COCCI  Final   Report Status PENDING  Incomplete  Respiratory Panel by PCR     Status: Abnormal   Collection Time: 08/26/17  7:08 PM  Result Value Ref Range Status   Adenovirus NOT DETECTED NOT DETECTED Final   Coronavirus 229E NOT DETECTED NOT DETECTED Final   Coronavirus HKU1 NOT DETECTED NOT DETECTED Final   Coronavirus NL63 NOT DETECTED NOT DETECTED Final   Coronavirus OC43 NOT DETECTED NOT DETECTED Final   Metapneumovirus NOT DETECTED NOT DETECTED Final   Rhinovirus / Enterovirus DETECTED (A) NOT DETECTED Final   Influenza A NOT DETECTED NOT DETECTED Final   Influenza B NOT DETECTED NOT DETECTED Final   Parainfluenza Virus 1 NOT DETECTED NOT DETECTED Final   Parainfluenza Virus 2 NOT DETECTED NOT DETECTED Final   Parainfluenza Virus 3 NOT DETECTED NOT DETECTED Final   Parainfluenza Virus 4 NOT DETECTED NOT DETECTED Final   Respiratory Syncytial Virus NOT DETECTED NOT DETECTED Final   Bordetella pertussis NOT DETECTED NOT DETECTED Final   Chlamydophila pneumoniae NOT DETECTED NOT DETECTED Final   Mycoplasma pneumoniae NOT  DETECTED NOT DETECTED Final  Blood Culture ID Panel (Reflexed)     Status: Abnormal   Collection Time: 08/26/17  7:08 PM  Result Value Ref Range Status   Enterococcus species NOT DETECTED NOT DETECTED Final   Listeria monocytogenes NOT DETECTED NOT DETECTED Final   Staphylococcus species DETECTED (A) NOT DETECTED Final    Comment: Methicillin (oxacillin) resistant coagulase negative staphylococcus. Possible blood culture contaminant (unless isolated from more than one blood culture draw or clinical case suggests pathogenicity). No antibiotic treatment is indicated for blood  culture contaminants. CRITICAL RESULT CALLED TO, READ BACK BY AND VERIFIED WITH: J LEDFORD [facility] 08/28/17 0247 JDW  Staphylococcus aureus NOT DETECTED NOT DETECTED Final   Methicillin resistance DETECTED (A) NOT DETECTED Final    Comment: CRITICAL RESULT CALLED TO, READ BACK BY AND VERIFIED WITH: J LEDFORD PHARMD 08/28/17 0247 JDW    Streptococcus species NOT DETECTED NOT DETECTED Final   Streptococcus agalactiae NOT DETECTED NOT DETECTED Final   Streptococcus pneumoniae NOT DETECTED NOT DETECTED Final   Streptococcus pyogenes NOT DETECTED NOT DETECTED Final   Acinetobacter baumannii NOT DETECTED NOT DETECTED Final   Enterobacteriaceae species NOT DETECTED NOT DETECTED Final   Enterobacter cloacae complex NOT DETECTED NOT DETECTED Final   Escherichia coli NOT DETECTED NOT DETECTED Final   Klebsiella oxytoca NOT DETECTED NOT DETECTED Final   Klebsiella pneumoniae NOT DETECTED NOT DETECTED Final   Proteus species NOT DETECTED NOT DETECTED Final   Serratia marcescens NOT DETECTED NOT DETECTED Final   Haemophilus influenzae NOT DETECTED NOT DETECTED Final   Neisseria meningitidis NOT DETECTED NOT DETECTED Final   Pseudomonas aeruginosa NOT DETECTED NOT DETECTED Final   Candida albicans NOT DETECTED NOT DETECTED Final   Candida glabrata NOT DETECTED NOT DETECTED Final   Candida krusei NOT DETECTED NOT DETECTED Final    Candida parapsilosis NOT DETECTED NOT DETECTED Final   Candida tropicalis NOT DETECTED NOT DETECTED Final  Culture, blood (routine x 2) Call MD if unable to obtain prior to antibiotics being given     Status: None (Preliminary result)   Collection Time: 08/26/17 11:58 PM  Result Value Ref Range Status   Specimen Description BLOOD RIGHT ARM  Final   Special Requests   Final    BOTTLES DRAWN AEROBIC AND ANAEROBIC Blood Culture adequate volume   Culture   Final    NO GROWTH 1 DAY Performed at [facility], [address], [place], [place] [address]    Report Status PENDING  Incomplete     Labs: BNP (last 3 results) No results for input(s): BNP in the last 8760 hours. Basic Metabolic Panel: Recent Labs  Lab 08/26/17 0640 08/27/17 0456  NA 138 140  K 3.5 4.1  CL 104 110  CO2 24 22  GLUCOSE 111* 105*  BUN 7 5*  CREATININE 0.67 0.53  CALCIUM 8.9 8.9   Liver Function Tests: No results for input(s): AST, ALT, ALKPHOS, BILITOT, PROT, ALBUMIN in the last 168 hours. No results for input(s): LIPASE, AMYLASE in the last 168 hours. No results for input(s): AMMONIA in the last 168 hours. CBC: Recent Labs  Lab 08/26/17 0640 08/27/17 0456  WBC 12.7* 15.2*  NEUTROABS 9.8*  --   HGB 13.9 13.4  HCT 41.3 40.8  MCV 88.2 88.5  PLT 196 198   Cardiac Enzymes: No results for input(s): CKTOTAL, CKMB, CKMBINDEX, TROPONINI in the last 168 hours. BNP: Invalid input(s): POCBNP CBG: No results for input(s): GLUCAP in the last 168 hours. D-Dimer No results for input(s): DDIMER in the last 72 hours. Hgb A1c No results for input(s): HGBA1C in the last 72 hours. Lipid Profile No results for input(s): CHOL, HDL, LDLCALC, TRIG, CHOLHDL, LDLDIRECT in the last 72 hours. Thyroid function studies No results for input(s): TSH, T4TOTAL, T3FREE, THYROIDAB in the last 72 hours.  Invalid input(s): FREET3 Anemia work up No results for input(s): VITAMINB12, FOLATE, FERRITIN, TIBC, IRON,  RETICCTPCT in the last 72 hours. Urinalysis    Component Value Date/Time   COLORURINE YELLOW 02/28/2017 0925   APPEARANCEUR CLEAR 02/28/2017 0925   APPEARANCEUR Clear 08/31/2013 0424   LABSPEC 1.011 02/28/2017 2725  LABSPEC 1.023 08/31/2013 0424   PHURINE 8.0 02/28/2017 0925   GLUCOSEU NEGATIVE 02/28/2017 0925   GLUCOSEU Negative 08/31/2013 0424   HGBUR NEGATIVE 02/28/2017 0925   BILIRUBINUR NEGATIVE 02/28/2017 0925   BILIRUBINUR Negative 08/31/2013 0424   KETONESUR NEGATIVE 02/28/2017 0925   PROTEINUR NEGATIVE 02/28/2017 0925   UROBILINOGEN 0.2 02/26/2017 1553   NITRITE NEGATIVE 02/28/2017 0925   LEUKOCYTESUR MODERATE (A) 02/28/2017 0925   LEUKOCYTESUR Negative 08/31/2013 0424   Sepsis Labs Invalid input(s): PROCALCITONIN,  WBC,  LACTICIDVEN Microbiology Recent Results (from the past 240 hour(s))  Culture, blood (routine x 2) Call MD if unable to obtain prior to antibiotics being given     Status: None (Preliminary result)   Collection Time: 08/26/17  7:08 PM  Result Value Ref Range Status   Specimen Description BLOOD LEFT ARM  Final   Special Requests   Final    BOTTLES DRAWN AEROBIC ONLY Blood Culture results may not be optimal due to an inadequate volume of blood received in culture bottles   Culture  Setup Time   Final    AEROBIC BOTTLE ONLY GRAM POSITIVE COCCI CRITICAL RESULT CALLED TO, READ BACK BY AND VERIFIED WITH: Melven Sartorius [facility] 08/28/17 0247 JDW Performed at [facility], [address], [place], [place] [address]    Culture GRAM POSITIVE COCCI  Final   Report Status PENDING  Incomplete  Respiratory Panel by PCR     Status: Abnormal   Collection Time: 08/26/17  7:08 PM  Result Value Ref Range Status   Adenovirus NOT DETECTED NOT DETECTED Final   Coronavirus 229E NOT DETECTED NOT DETECTED Final   Coronavirus HKU1 NOT DETECTED NOT DETECTED Final   Coronavirus NL63 NOT DETECTED NOT DETECTED Final   Coronavirus OC43 NOT DETECTED NOT DETECTED Final    Metapneumovirus NOT DETECTED NOT DETECTED Final   Rhinovirus / Enterovirus DETECTED (A) NOT DETECTED Final   Influenza A NOT DETECTED NOT DETECTED Final   Influenza B NOT DETECTED NOT DETECTED Final   Parainfluenza Virus 1 NOT DETECTED NOT DETECTED Final   Parainfluenza Virus 2 NOT DETECTED NOT DETECTED Final   Parainfluenza Virus 3 NOT DETECTED NOT DETECTED Final   Parainfluenza Virus 4 NOT DETECTED NOT DETECTED Final   Respiratory Syncytial Virus NOT DETECTED NOT DETECTED Final   Bordetella pertussis NOT DETECTED NOT DETECTED Final   Chlamydophila pneumoniae NOT DETECTED NOT DETECTED Final   Mycoplasma pneumoniae NOT DETECTED NOT DETECTED Final  Blood Culture ID Panel (Reflexed)     Status: Abnormal   Collection Time: 08/26/17  7:08 PM  Result Value Ref Range Status   Enterococcus species NOT DETECTED NOT DETECTED Final   Listeria monocytogenes NOT DETECTED NOT DETECTED Final   Staphylococcus species DETECTED (A) NOT DETECTED Final    Comment: Methicillin (oxacillin) resistant coagulase negative staphylococcus. Possible blood culture contaminant (unless isolated from more than one blood culture draw or clinical case suggests pathogenicity). No antibiotic treatment is indicated for blood  culture contaminants. CRITICAL RESULT CALLED TO, READ BACK BY AND VERIFIED WITH: J LEDFORD PHARMD 08/28/17 0247 JDW    Staphylococcus aureus NOT DETECTED NOT DETECTED Final   Methicillin resistance DETECTED (A) NOT DETECTED Final    Comment: CRITICAL RESULT CALLED TO, READ BACK BY AND VERIFIED WITH: J LEDFORD PHARMD 08/28/17 0247 JDW    Streptococcus species NOT DETECTED NOT DETECTED Final   Streptococcus agalactiae NOT DETECTED NOT DETECTED Final   Streptococcus pneumoniae NOT DETECTED NOT DETECTED Final   Streptococcus pyogenes  NOT DETECTED NOT DETECTED Final   Acinetobacter baumannii NOT DETECTED NOT DETECTED Final   Enterobacteriaceae species NOT DETECTED NOT DETECTED Final   Enterobacter  cloacae complex NOT DETECTED NOT DETECTED Final   Escherichia coli NOT DETECTED NOT DETECTED Final   Klebsiella oxytoca NOT DETECTED NOT DETECTED Final   Klebsiella pneumoniae NOT DETECTED NOT DETECTED Final   Proteus species NOT DETECTED NOT DETECTED Final   Serratia marcescens NOT DETECTED NOT DETECTED Final   Haemophilus influenzae NOT DETECTED NOT DETECTED Final   Neisseria meningitidis NOT DETECTED NOT DETECTED Final   Pseudomonas aeruginosa NOT DETECTED NOT DETECTED Final   Candida albicans NOT DETECTED NOT DETECTED Final   Candida glabrata NOT DETECTED NOT DETECTED Final   Candida krusei NOT DETECTED NOT DETECTED Final   Candida parapsilosis NOT DETECTED NOT DETECTED Final   Candida tropicalis NOT DETECTED NOT DETECTED Final  Culture, blood (routine x 2) Call MD if unable to obtain prior to antibiotics being given     Status: None (Preliminary result)   Collection Time: 08/26/17 11:58 PM  Result Value Ref Range Status   Specimen Description BLOOD RIGHT ARM  Final   Special Requests   Final    BOTTLES DRAWN AEROBIC AND ANAEROBIC Blood Culture adequate volume   Culture   Final    NO GROWTH 1 DAY Performed at [facility], [address], [place], [place] [address]    Report Status PENDING  Incomplete     Time coordinating discharge:  SIGNED:   Alwyn Ren, MD  [facility] 08/28/2017, 12:15 PM Pager   If 7PM-7AM, please contact night-coverage [email or web address] Password [email or web address]

## 2017-08-28 NOTE — Progress Notes (Signed)
Patient discharged home per MD. Discharge instructions reviewed. Patient verbalized understanding of how to take medications including antibiotics and prednisone. Patient declined wheelchair offer at discharge. Gait steady. Bess Kinds, RN

## 2017-08-30 LAB — CULTURE, BLOOD (ROUTINE X 2)

## 2017-09-01 LAB — CULTURE, BLOOD (ROUTINE X 2)
Culture: NO GROWTH
Special Requests: ADEQUATE

## 2017-09-21 ENCOUNTER — Telehealth: Payer: Self-pay | Admitting: *Deleted

## 2017-09-21 NOTE — Telephone Encounter (Signed)
Morrie SheldonAshley from [employer] called stating she understands from the message you don't return calls if you are not a patient; but they are calling because they are trying to get all her bills and can't seem to get her physician bills for our [facility].  Wants to know who to contact to get bill for 01/18/18 date of service.

## 2017-10-14 IMAGING — DX DG CHEST 1V PORT
1 series · 1 of 1 positions shown · non-contrast
Comparison: 08/31/2013 chest radiographs.

CLINICAL DATA: 25-year-old female chest pain with shortness of
breath since last night. Initial encounter.

EXAM:
PORTABLE CHEST 1 VIEW

[chest ap]
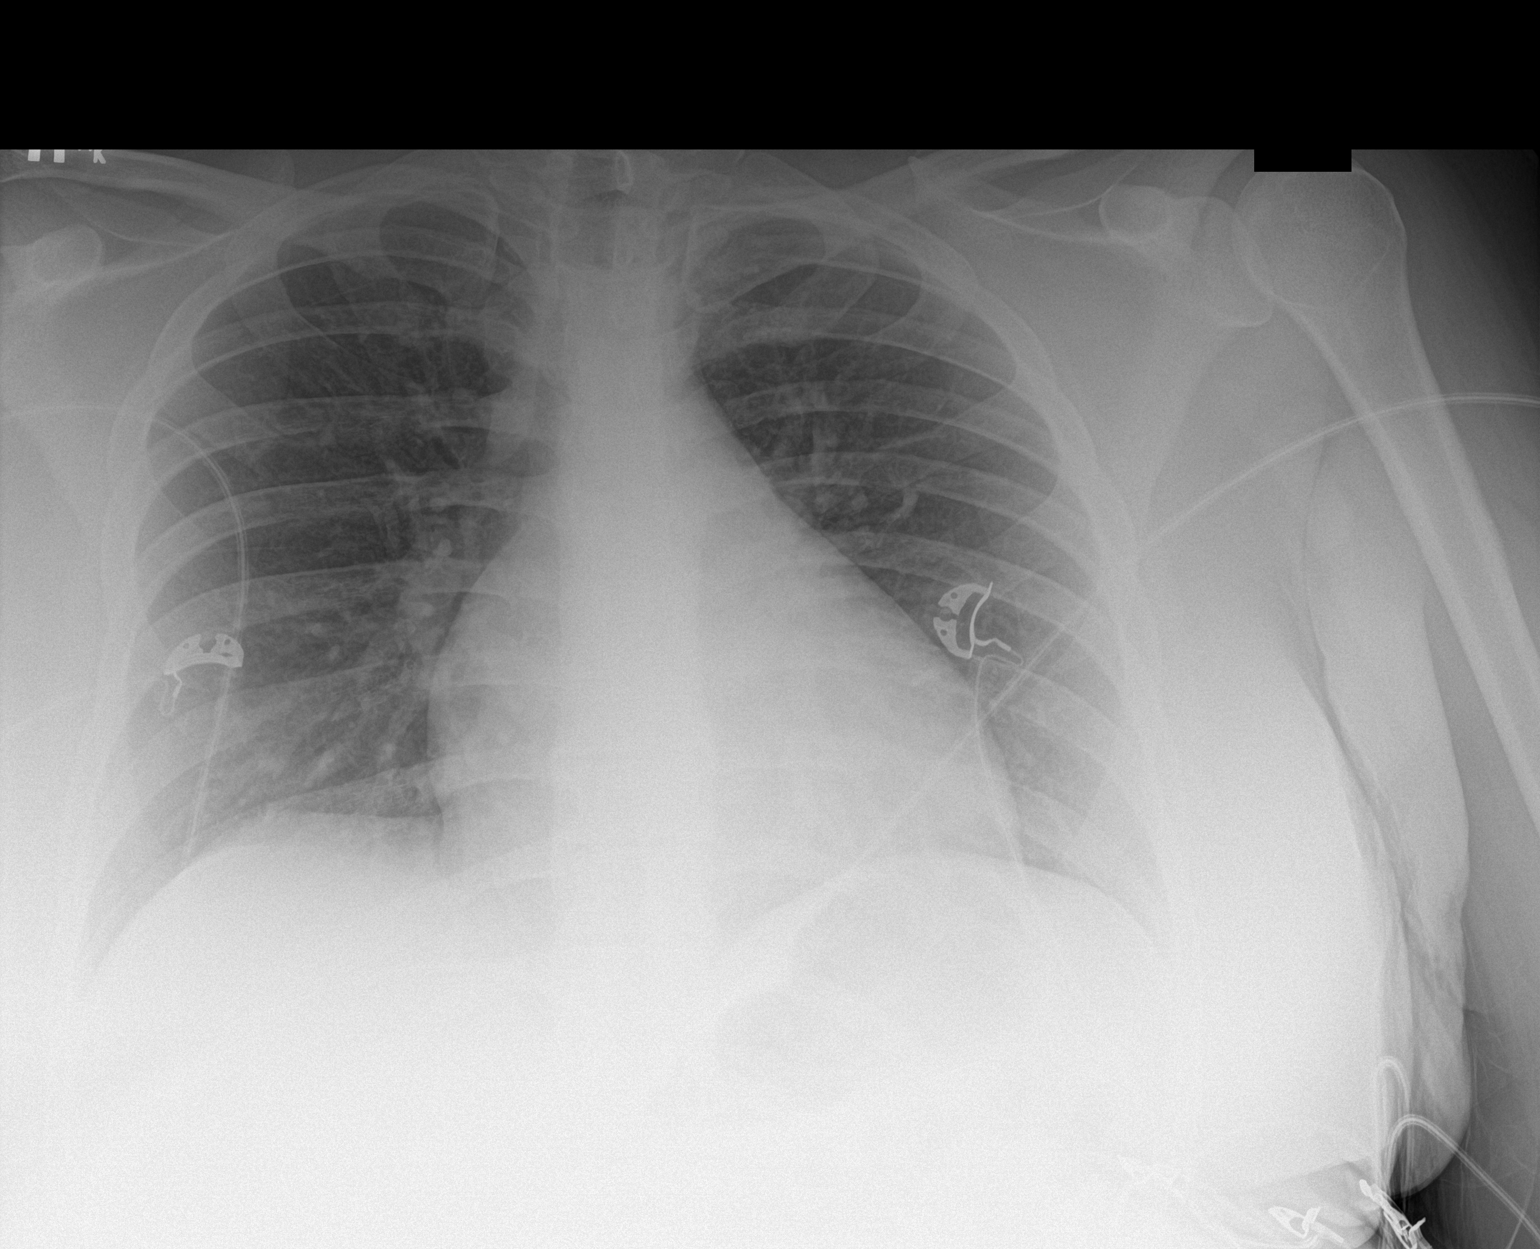

[1 of 1 positions shown; findings below may reference images not displayed]

FINDINGS: Portable AP upright view at at 2942 hours. Large body habitus.
Somewhat low lung volumes. Normal cardiac size and mediastinal
contours. Visualized tracheal air column is within normal limits.
Allowing for portable technique the lungs are clear. No pneumothorax
or pleural effusion. No osseous abnormality identified.
IMPRESSION: Negative.  No acute cardiopulmonary abnormality.

## 2018-04-06 ENCOUNTER — Encounter: Payer: Medicaid Other | Admitting: Certified Nurse Midwife

## 2018-06-22 IMAGING — US US OB TRANSVAGINAL
1 series · 14 of 28 positions shown · non-contrast
Comparison: None.

CLINICAL DATA: Pregnant, vaginal bleeding

EXAM:
OBSTETRIC <14 WK US AND TRANSVAGINAL OB US
TECHNIQUE: Both transabdominal and transvaginal ultrasound examinations were
performed for complete evaluation of the gestation as well as the
maternal uterus, adnexal regions, and pelvic cul-de-sac.
Transvaginal technique was performed to assess early pregnancy.

[Series 1: us ob transvaginal · 0.25mm/px · 14 of 68 slices shown]
[im 3/68]
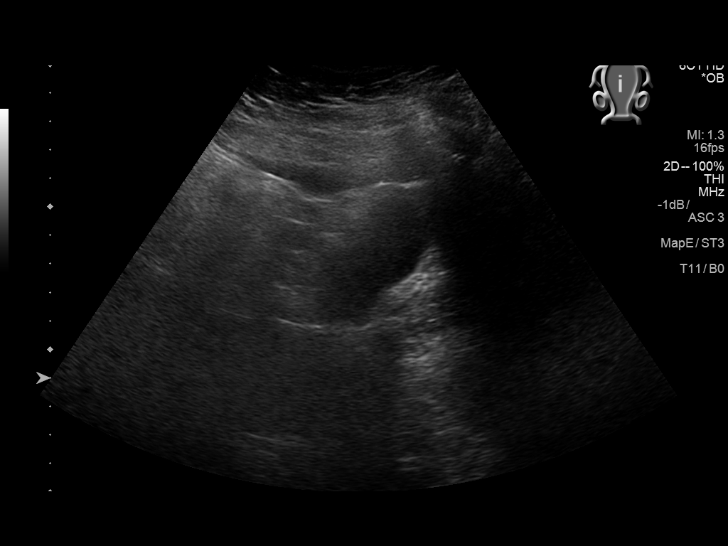
[im 8/68]
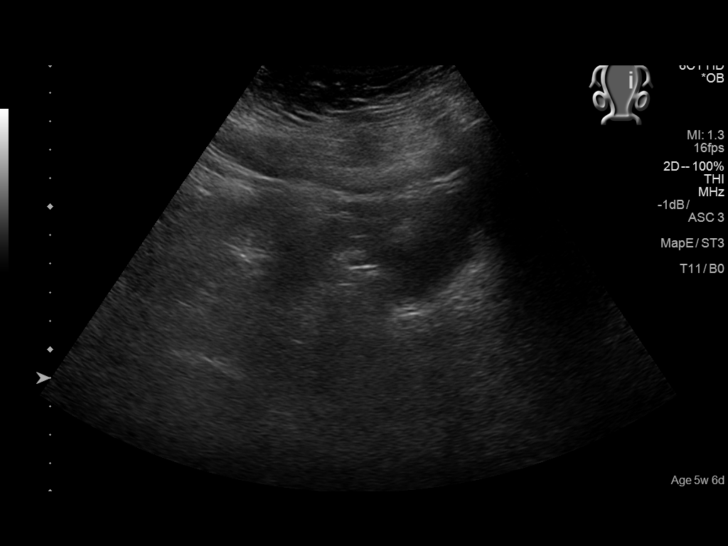
[im 13/68]
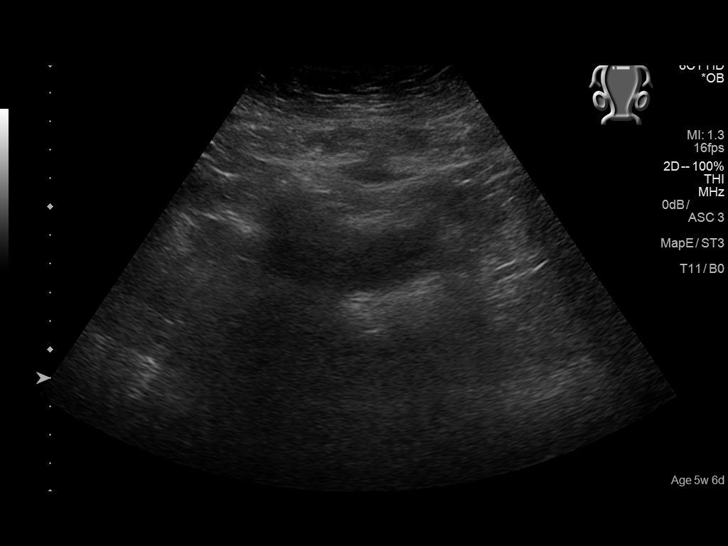
[im 18/68]
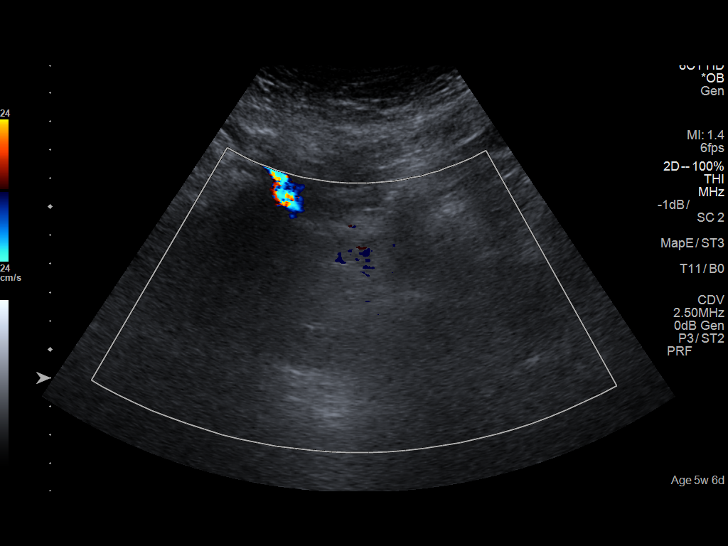
[im 23/68]
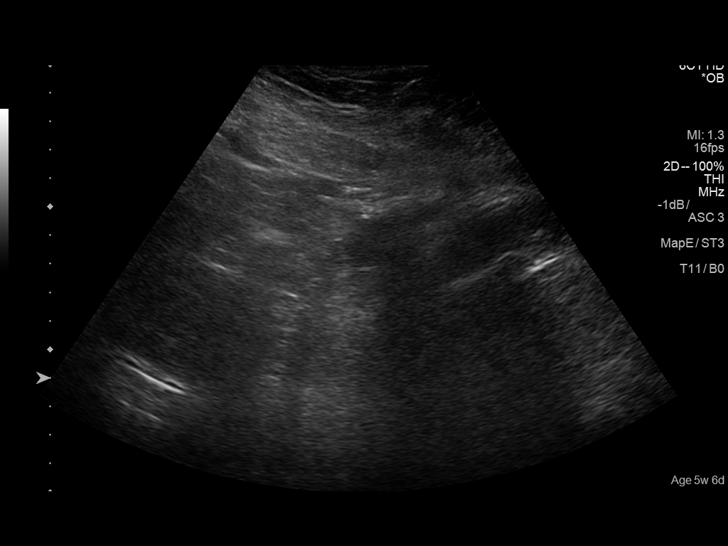
[im 28/68]
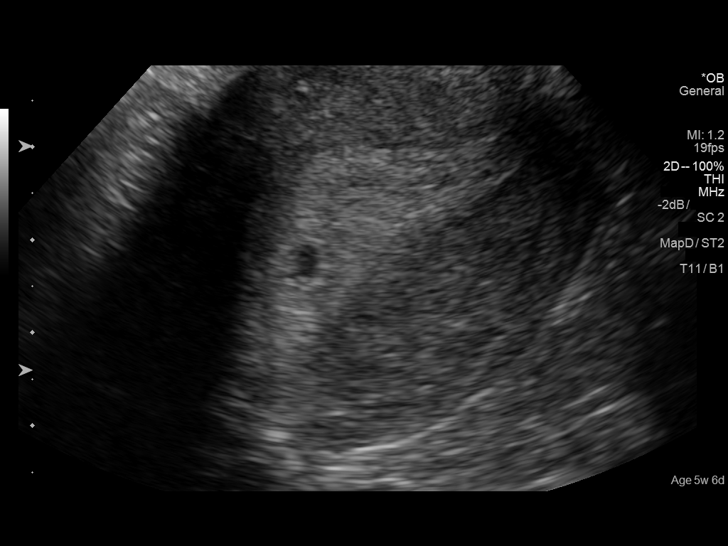
[im 33/68]
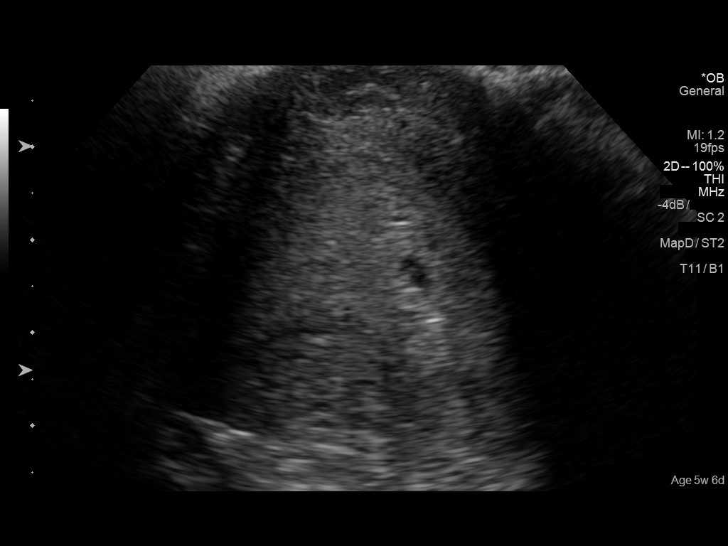
[im 38/68]
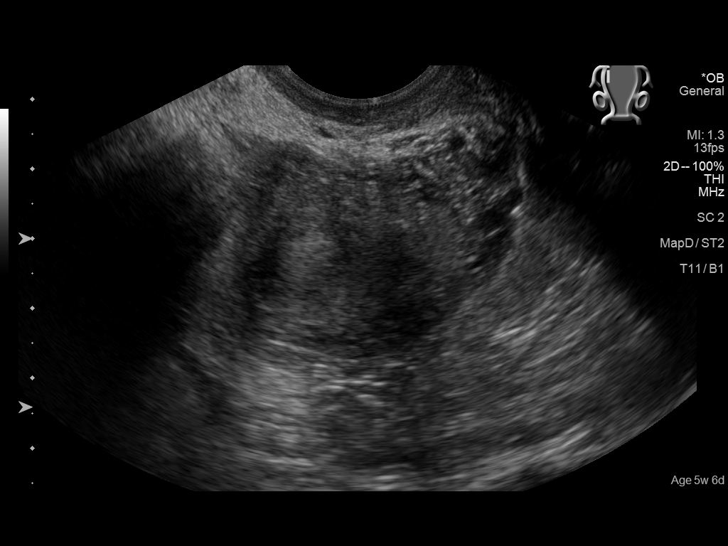
[im 43/68]
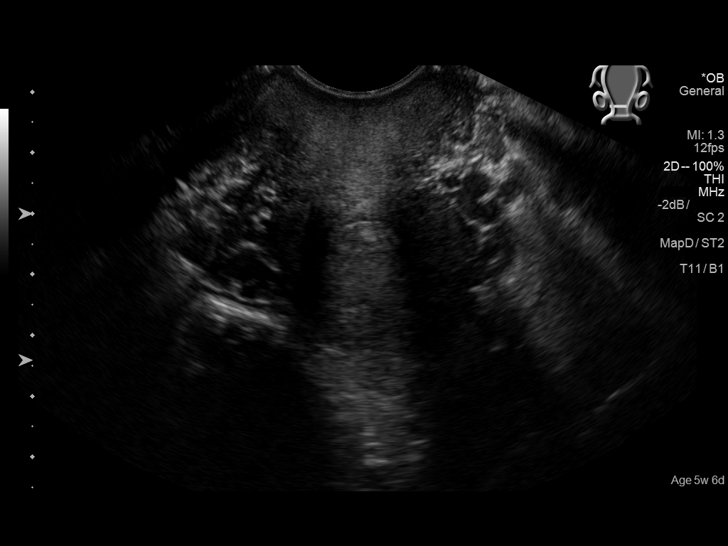
[im 48/68]
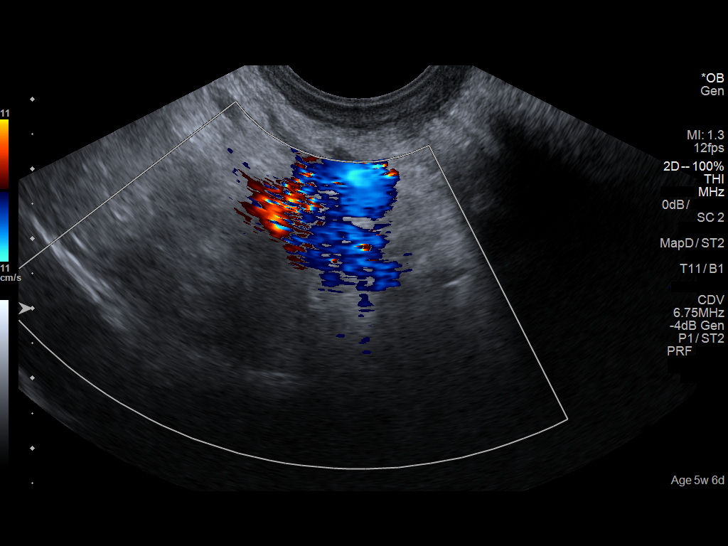
[im 53/68]
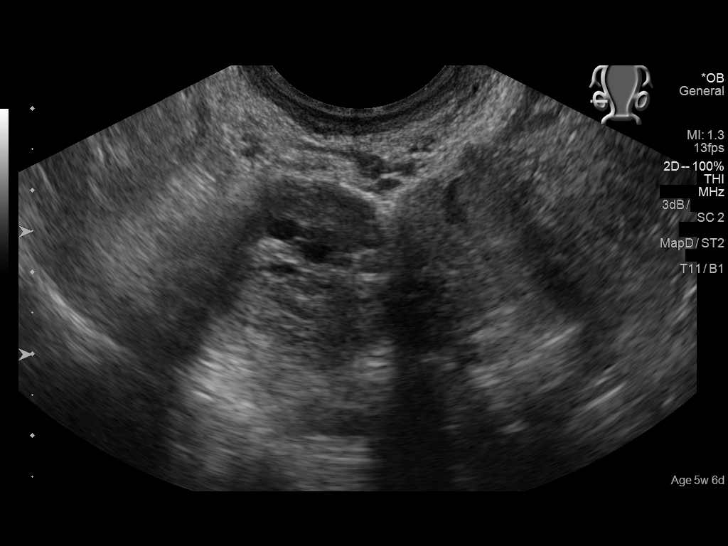
[im 58/68]
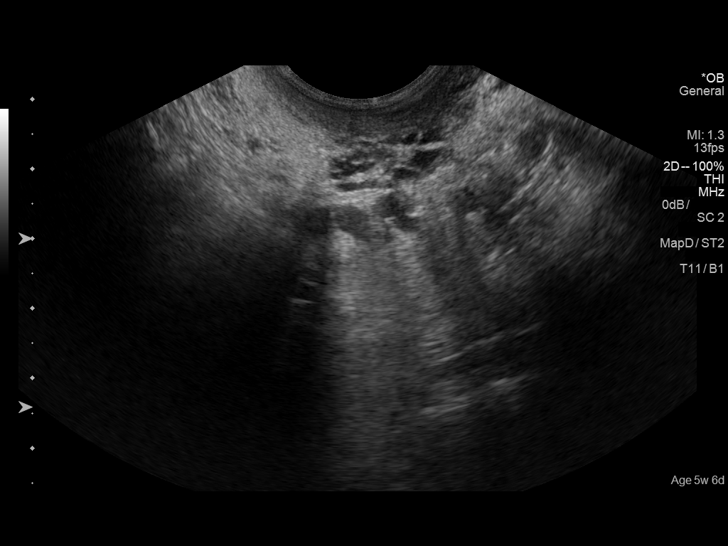
[im 63/68]
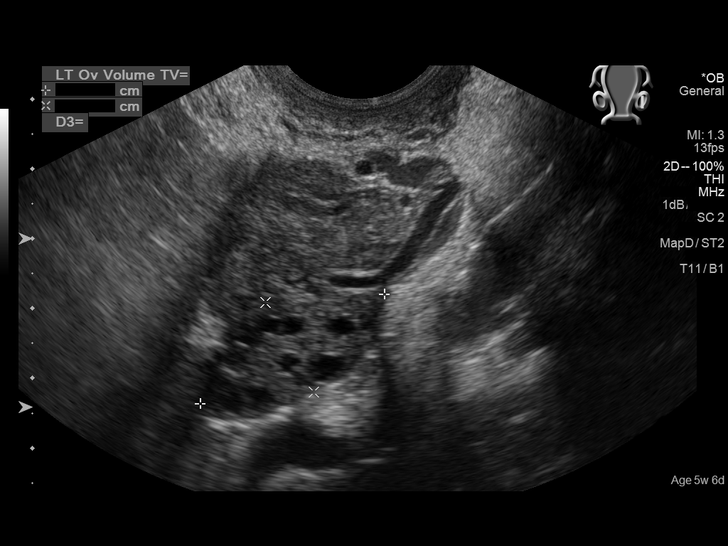
[im 68/68]
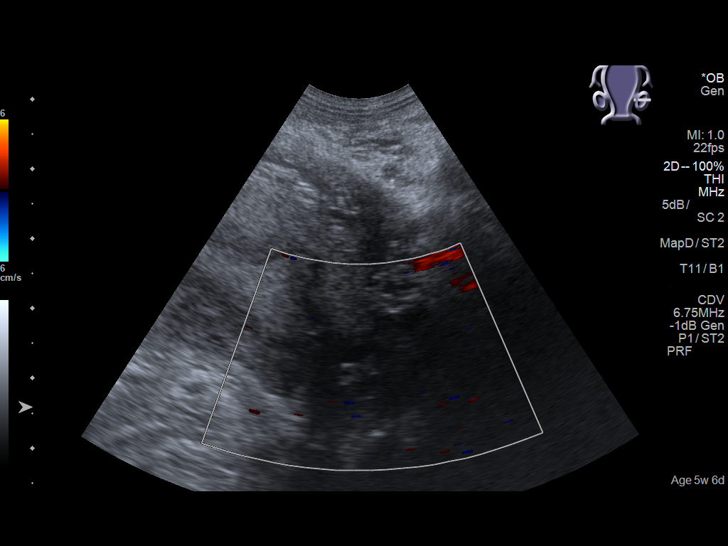

[14 of 28 positions shown; findings below may reference images not displayed]

FINDINGS: Intrauterine gestational sac: Single

Yolk sac:  Not visualized

MSD: 3.3  mm   5 w   0  d

Subchorionic hemorrhage:  None visualized.

Maternal uterus/adnexae: Bilateral ovaries are within normal limits.

No free fluid.
IMPRESSION: Probable single intrauterine gestational sac, measuring 5 weeks 0
days by mean sac diameter.

No yolk sac or fetal pole. Consider follow-up pelvic ultrasound in
14 days to confirm viability, as clinically warranted.

## 2018-07-08 IMAGING — US US OB TRANSVAGINAL
1 series · 14 of 28 positions shown · non-contrast
Comparison: 12/22/2015

CLINICAL DATA: Pelvic pain, bleeding and spotting since 12/10/2015

EXAM:
OBSTETRIC <14 WK US AND TRANSVAGINAL OB US
TECHNIQUE: Both transabdominal and transvaginal ultrasound examinations were
performed for complete evaluation of the gestation as well as the
maternal uterus, adnexal regions, and pelvic cul-de-sac.
Transvaginal technique was performed to assess early pregnancy.

[Series 1: us ob transvaginal · 0.22mm/px · 14 of 77 slices shown]
[im 3/77]
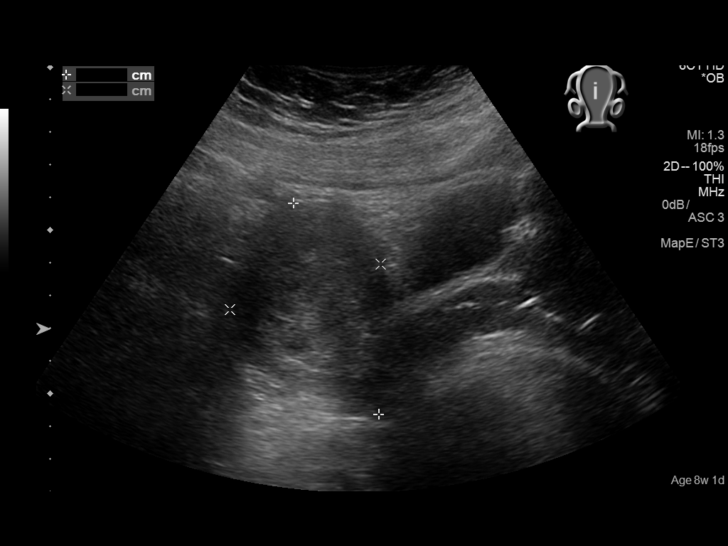
[im 9/77]
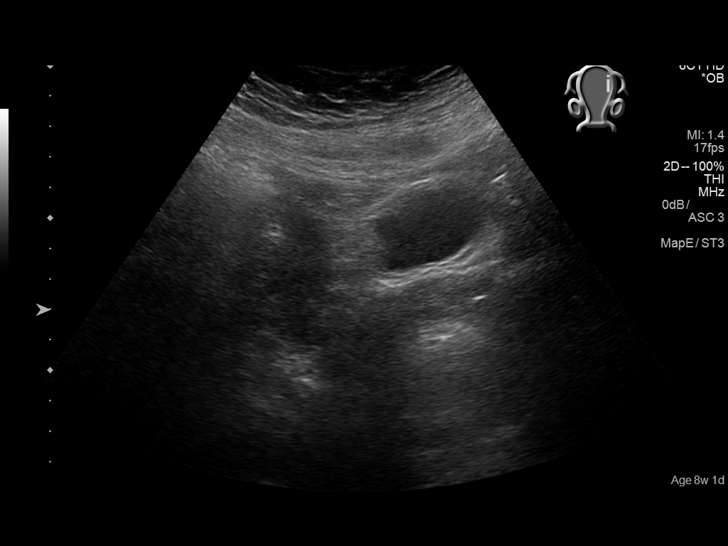
[im 15/77]
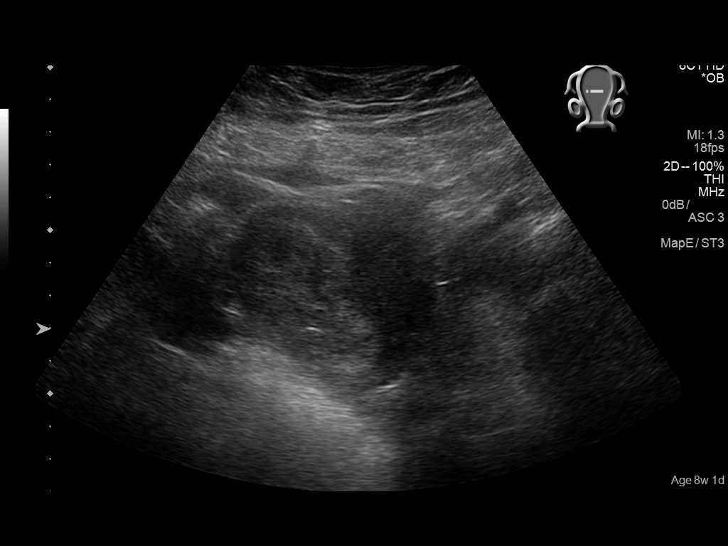
[im 20/77]
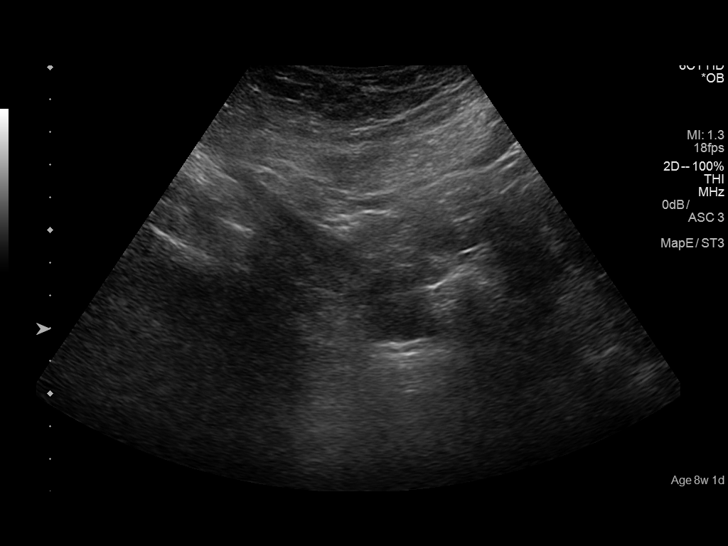
[im 26/77]
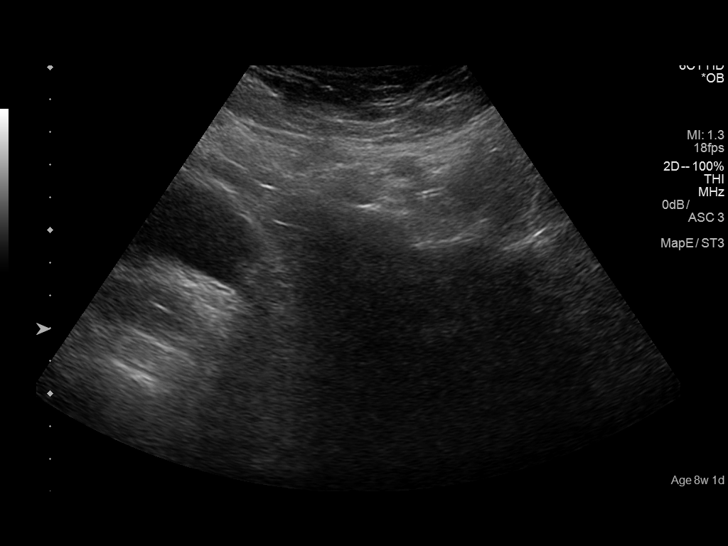
[im 31/77]
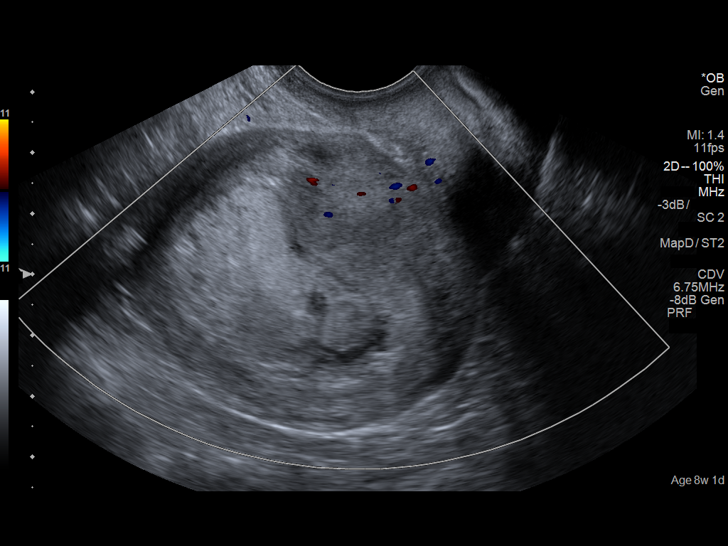
[im 37/77]
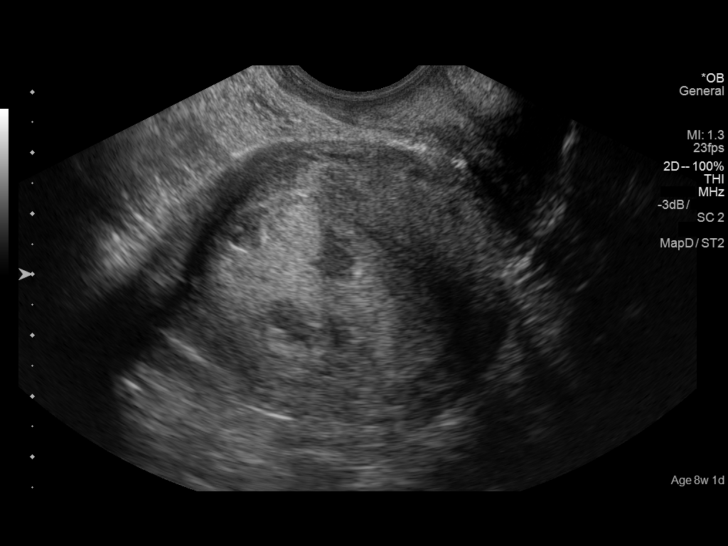
[im 43/77]
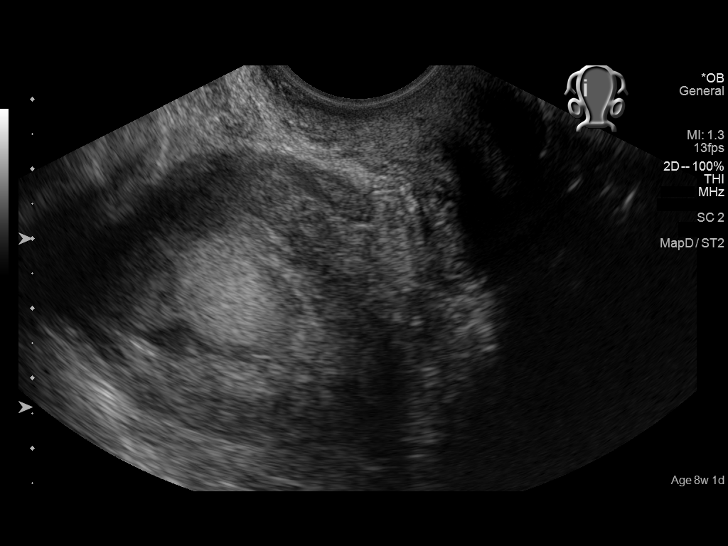
[im 48/77]
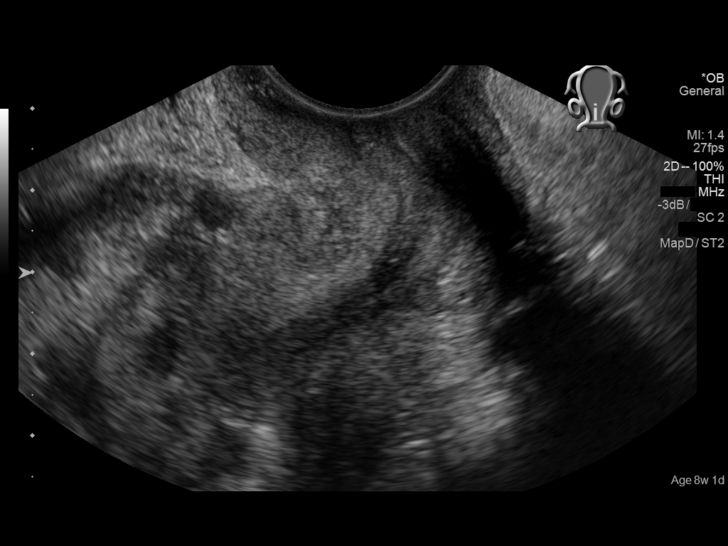
[im 54/77]
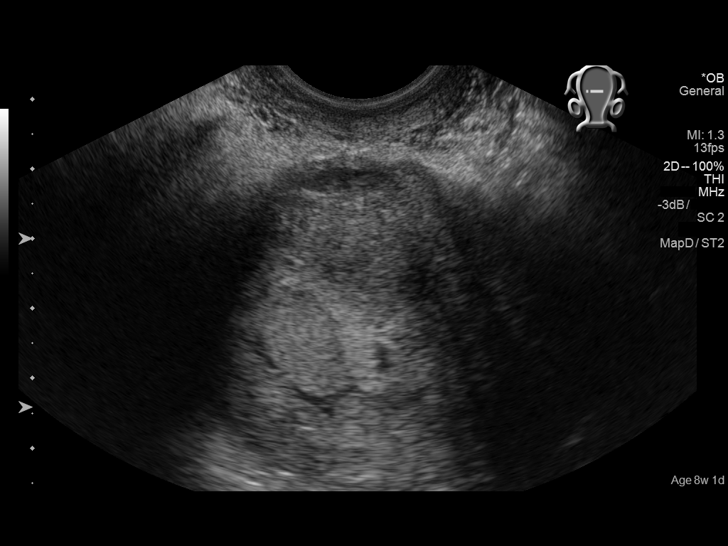
[im 60/77]
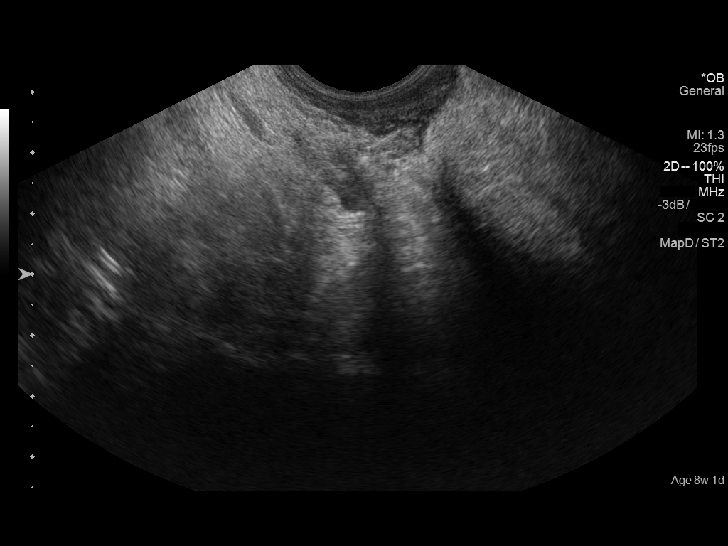
[im 65/77]
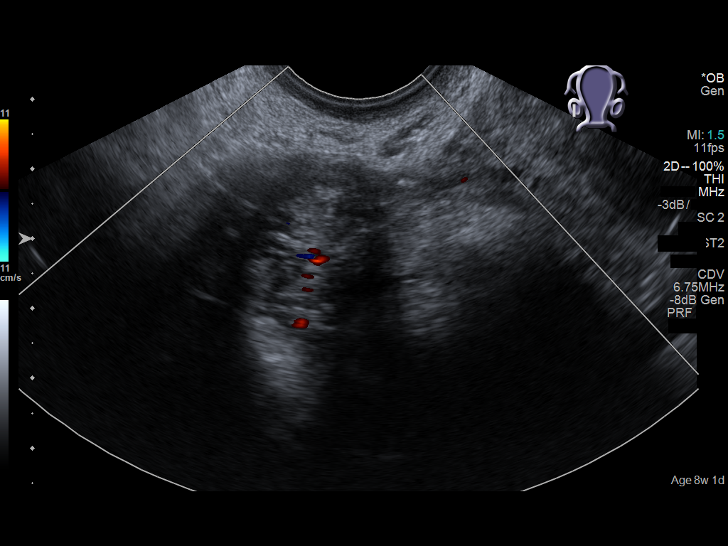
[im 71/77]
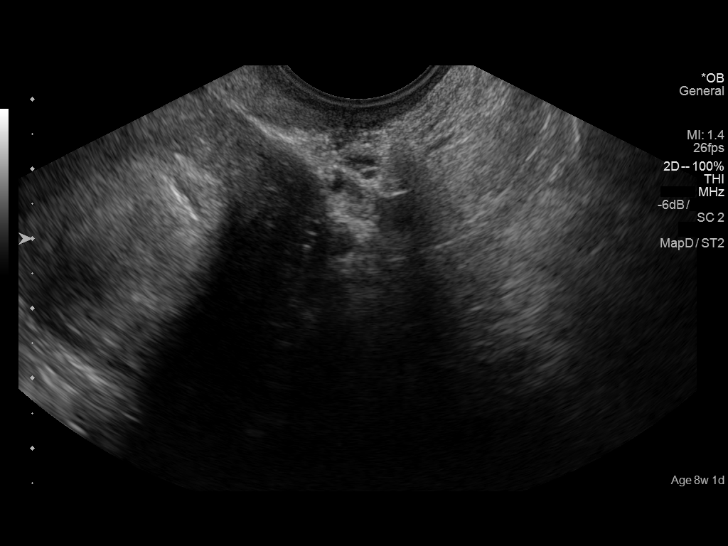
[im 77/77]
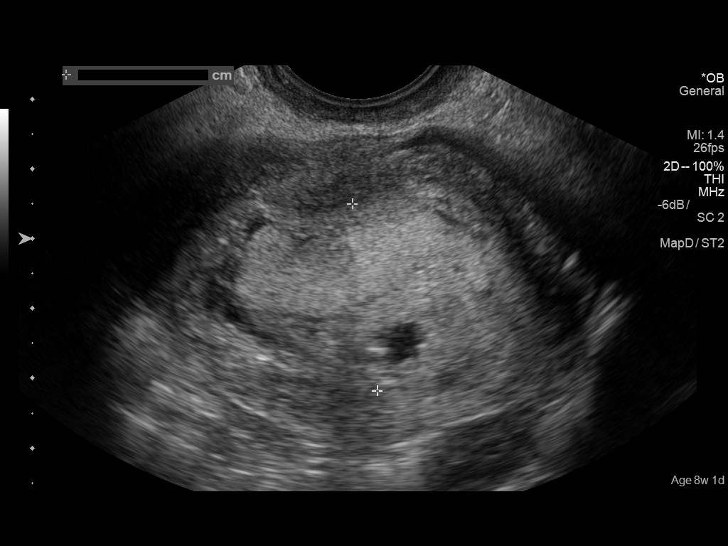

[14 of 28 positions shown; findings below may reference images not displayed]

FINDINGS: Intrauterine gestational sac: None

Yolk sac:  Not visualized

Embryo:  Not visualized

Cardiac Activity:

Heart Rate:   bpm

MSD:   mm    w     d

CRL:    mm    w    d                  US EDC:

Subchorionic hemorrhage:  None visualized.

Maternal uterus/adnexae: Large amount of heterogeneous, echogenic
material seen within the endometrium. No visible internal blood
flow. No adnexal masses. No free fluid.
IMPRESSION: No intrauterine gestational visualized. Large echogenic material
within the endometrium likely reflects blood products as no internal
blood flow is seen.

## 2018-10-29 ENCOUNTER — Telehealth: Payer: Self-pay | Admitting: Advanced Practice Midwife

## 2018-10-29 NOTE — Telephone Encounter (Signed)
Attempted to call patient about her appointment on 7/13 @ 11:15. No answer, left voicemail instructing patient to wear a face mask and no visitors are allowed during the appointment . Patient was not screened for symptoms. Symptom list left and instructed to give the office a call back if she has any symptoms and not to attend the appointment.

## 2018-11-01 ENCOUNTER — Encounter: Payer: Self-pay | Admitting: Advanced Practice Midwife

## 2018-11-01 ENCOUNTER — Ambulatory Visit (INDEPENDENT_AMBULATORY_CARE_PROVIDER_SITE_OTHER): Payer: Medicaid Other | Admitting: Advanced Practice Midwife

## 2018-11-01 ENCOUNTER — Other Ambulatory Visit: Payer: Self-pay

## 2018-11-01 ENCOUNTER — Other Ambulatory Visit (HOSPITAL_COMMUNITY)
Admission: RE | Admit: 2018-11-01 | Discharge: 2018-11-01 | Disposition: A | Discharge status: Home / Self Care | Payer: Medicaid Other | Source: Ambulatory Visit | Attending: Advanced Practice Midwife | Admitting: Advanced Practice Midwife

## 2018-11-01 VITALS — BP 158/102 | HR 96 | Wt 334.6 lb

## 2018-11-01 DIAGNOSIS — Z30432 Encounter for removal of intrauterine contraceptive device: Secondary | ICD-10-CM

## 2018-11-01 DIAGNOSIS — Z0000 Encounter for general adult medical examination without abnormal findings: Secondary | ICD-10-CM | POA: Diagnosis present

## 2018-11-01 NOTE — Progress Notes (Signed)
GYNECOLOGY ANNUAL PREVENTATIVE CARE ENCOUNTER NOTE  Subjective:   Carla Nichols is a 28 y.o. G50P1011 female here for a routine annual gynecologic exam.  Current complaints: irregular bleeding.   Denies abnormal discharge, pelvic pain, problems with intercourse or other gynecologic concerns.   Patient would like her IUD removed due to irregular bleeding.    Gynecologic History No LMP recorded. Contraception: IUD Last Pap: unsure. Results were: unsure Last mammogram: NA, age. Results were: NA  Obstetric History OB History  Gravida Para Term Preterm AB Living  2 1 1  0 1 1  SAB TAB Ectopic Multiple Live Births  0 0 0 0 1    # Outcome Date GA Lbr Len/2nd Weight Sex Delivery Anes PTL Lv  2 Term 03/05/17 [redacted]w[redacted]d 13:40 / 00:20 5 lb 9.9 oz (2.549 kg) M Vag-Spont EPI  LIV     Birth Comments: WNL  1 Molar 01/24/16 [redacted]w[redacted]d            Complications: Molar pregnancy    Past Medical History:  Diagnosis Date  . Asthma    pulmicort and albuterol inhaler as needed  . Diabetes mellitus without complication ([place])    type 2 diet controlled  . Dyspnea   . Hypertension    no meds  . Seizures ([place])    as a child    Past Surgical History:  Procedure Laterality Date  . DILATION AND EVACUATION N/A 01/24/2016   Procedure: DILATATION AND EVACUATION;  Surgeon: Gae Dry, MD;  Location: [facility] ORS;  Service: Gynecology;  Laterality: N/A;  . TONSILLECTOMY    . WISDOM TOOTH EXTRACTION      Current Outpatient Medications on File Prior to Visit  Medication Sig Dispense Refill  . albuterol (PROVENTIL) (2.5 MG/3ML) 0.083% nebulizer solution Take 3 mLs (2.5 mg total) by nebulization every 4 (four) hours as needed for wheezing or shortness of breath. 30 vial 0  . guaiFENesin (MUCINEX) 600 MG 12 hr tablet Take 1 tablet (600 mg total) by mouth 2 (two) times daily. (Patient not taking: Reported on 11/01/2018)    . predniSONE (STERAPRED UNI-PAK 21 TAB) 10 MG (21) TBPK tablet Take 3 tab for 3 days then 2  tab for 3 days then 1 tab for the rest of the days until done. (Patient not taking: Reported on 11/01/2018) 21 tablet 0   No current facility-administered medications on file prior to visit.     Allergies  Allergen Reactions  . Bee Venom Swelling  . Shellfish Allergy Anaphylaxis    Betadine OK    Social History   Socioeconomic History  . Marital status: Single    Spouse name: Not on file  . Number of children: Not on file  . Years of education: Not on file  . Highest education level: Not on file  Occupational History  . Not on file  Social Needs  . Financial resource strain: Not on file  . Food insecurity    Worry: Not on file    Inability: Not on file  . Transportation needs    Medical: Not on file    Non-medical: Not on file  Tobacco Use  . Smoking status: Current Some Day Smoker    Last attempt to quit: 12/21/2015    Years since quitting: 2.8  . Smokeless tobacco: Never Used  Substance and Sexual Activity  . Alcohol use: No  . Drug use: No  . Sexual activity: Yes    Birth control/protection: None  Lifestyle  . Physical  activity    Days per week: Not on file    Minutes per session: Not on file  . Stress: Not on file  Relationships  . Social Musicianconnections    Talks on phone: Not on file    Gets together: Not on file    Attends religious service: Not on file    Active member of club or organization: Not on file    Attends meetings of clubs or organizations: Not on file    Relationship status: Not on file  . Intimate partner violence    Fear of current or ex partner: Not on file    Emotionally abused: Not on file    Physically abused: Not on file    Forced sexual activity: Not on file  Other Topics Concern  . Not on file  Social History Narrative  . Not on file    Family History  Problem Relation Age of Onset  . Diabetes Mother   . Arthritis Mother   . Hypertension Father   . Arthritis Father   . Diabetes Maternal Grandmother   . Diabetes Paternal  Grandmother   . Hypertension Paternal Grandmother     The following portions of the patient's history were reviewed and updated as appropriate: allergies, current medications, past family history, past medical history, past social history, past surgical history and problem list.  Review of Systems Pertinent items noted in HPI and remainder of comprehensive ROS otherwise negative.   Objective:  BP (!) 158/102   Pulse 96   Wt (!) 334 lb 9.6 oz (151.8 kg)   BMI 59.27 kg/m  Physical Exam  Constitutional: She is oriented to person, place, and time and well-developed, well-nourished, and in no distress. No distress.  Cardiovascular: Normal rate.  Pulmonary/Chest: Effort normal.  Genitourinary:    Vagina, cervix and uterus normal.     Genitourinary Comments: IUD string at the [facility] IUD removed    Neurological: She is alert and oriented to person, place, and time.  Skin: Skin is warm and dry.  Psychiatric: Affect normal.  Nursing note and vitals reviewed.   GYNECOLOGY OFFICE PROCEDURE NOTE  Carla Nichols is a 28 y.o. G2P1011 here for [place] IUD removal.   IUD Removal  Patient identified, informed consent performed, consent signed.  Patient was in the dorsal lithotomy position, normal external genitalia was noted.  A speculum was placed in the patient's vagina, normal discharge was noted, no lesions. The cervix was visualized, no lesions, no abnormal discharge.  The strings of the IUD were grasped and pulled using ring forceps. The IUD was removed in its entirety. Patient tolerated the procedure well.    Patient will use condoms for contraception, and she was told to avoid teratogens, take PNV and folic acid.  Routine preventative health maintenance measures emphasized.   Assessment and Plan:  1. Encounter for IUD removal   2. Annual physical exam - Cytology - PAP( [place]) - Cervicovaginal ancillary only( [place]) - Patient advised to establish care with PCP for  blood pressure management.   Will follow up results of pap smear and manage accordingly. Routine preventative health maintenance measures emphasized. Please refer to After Visit Summary for other counseling recommendations.   Carla ShellerHeather Caidance Sybert DNP, CNM  11/01/18  11:52 AM

## 2018-11-03 LAB — CERVICOVAGINAL ANCILLARY ONLY
Bacterial vaginitis: POSITIVE — AB
Candida vaginitis: POSITIVE — AB
Chlamydia: NEGATIVE
Neisseria Gonorrhea: NEGATIVE
Trichomonas: NEGATIVE

## 2018-11-03 LAB — CYTOLOGY - PAP: Diagnosis: NEGATIVE

## 2018-11-03 MED ORDER — TERCONAZOLE 0.4 % VA CREA
1.0000 | TOPICAL_CREAM | Freq: Every day | VAGINAL | 0 refills | Status: DC
Start: 2018-11-03 — End: 2022-05-01

## 2018-11-03 MED ORDER — METRONIDAZOLE 500 MG PO TABS: 500.0000 mg | ORAL_TABLET | Freq: Two times a day (BID) | ORAL | 0 refills | Status: DC

## 2018-11-03 NOTE — Addendum Note (Signed)
Addended by: Marcille Buffy D on: 11/03/2018 05:17 PM   Modules accepted: Orders

## 2018-11-04 ENCOUNTER — Encounter: Payer: Self-pay | Admitting: *Deleted

## 2018-11-04 ENCOUNTER — Telehealth: Payer: Self-pay

## 2018-11-04 NOTE — Telephone Encounter (Signed)
Called Pt to advise of Rx sent to pharmacy on File, no answer, left Pt VM.

## 2020-03-29 ENCOUNTER — Ambulatory Visit
Admit: 2020-03-29 | Discharge: 2020-03-29 | Disposition: A | Discharge status: Home / Self Care | Payer: No Typology Code available for payment source

## 2020-03-29 ENCOUNTER — Encounter: Payer: Self-pay | Admitting: Emergency Medicine

## 2020-03-29 ENCOUNTER — Emergency Department
Admission: EM | Admit: 2020-03-29 | Discharge: 2020-03-29 | Disposition: A | Discharge status: Home / Self Care | Payer: Medicaid Other | Attending: Emergency Medicine | Admitting: Emergency Medicine

## 2020-03-29 ENCOUNTER — Emergency Department: Payer: Medicaid Other

## 2020-03-29 ENCOUNTER — Other Ambulatory Visit: Payer: Self-pay

## 2020-03-29 DIAGNOSIS — E119 Type 2 diabetes mellitus without complications: Secondary | ICD-10-CM | POA: Insufficient documentation

## 2020-03-29 DIAGNOSIS — I10 Essential (primary) hypertension: Secondary | ICD-10-CM | POA: Diagnosis not present

## 2020-03-29 DIAGNOSIS — J40 Bronchitis, not specified as acute or chronic: Secondary | ICD-10-CM | POA: Diagnosis not present

## 2020-03-29 DIAGNOSIS — F17200 Nicotine dependence, unspecified, uncomplicated: Secondary | ICD-10-CM | POA: Insufficient documentation

## 2020-03-29 DIAGNOSIS — R509 Fever, unspecified: Secondary | ICD-10-CM | POA: Diagnosis present

## 2020-03-29 MED ORDER — IPRATROPIUM-ALBUTEROL 0.5-2.5 (3) MG/3ML IN SOLN
3.0000 mL | Freq: Once | RESPIRATORY_TRACT | Status: AC
  Administered 2020-03-29: 3 mL via RESPIRATORY_TRACT
  Filled 2020-03-29: qty 3

## 2020-03-29 MED ORDER — IPRATROPIUM-ALBUTEROL 0.5-2.5 (3) MG/3ML IN SOLN: 3.0000 mL | RESPIRATORY_TRACT | 1 refills | Status: DC | PRN

## 2020-03-29 MED ORDER — PREDNISONE 50 MG PO TABS: 50.0000 mg | ORAL_TABLET | Freq: Every day | ORAL | 0 refills | Status: DC

## 2020-03-29 MED ORDER — ACETAMINOPHEN 325 MG PO TABS
650.0000 mg | ORAL_TABLET | Freq: Once | ORAL | Status: AC | PRN
  Administered 2020-03-29: 650 mg via ORAL
  Filled 2020-03-29: qty 2

## 2020-03-29 MED ORDER — METHYLPREDNISOLONE SODIUM SUCC 125 MG IJ SOLR
125.0000 mg | Freq: Once | INTRAMUSCULAR | Status: AC
  Administered 2020-03-29: 125 mg via INTRAMUSCULAR
  Filled 2020-03-29: qty 2

## 2020-03-29 MED ORDER — ALBUTEROL SULFATE (2.5 MG/3ML) 0.083% IN NEBU
5.0000 mg | INHALATION_SOLUTION | Freq: Once | RESPIRATORY_TRACT | Status: AC
  Administered 2020-03-29: 5 mg via RESPIRATORY_TRACT
  Filled 2020-03-29: qty 6

## 2020-03-29 NOTE — ED Provider Notes (Signed)
[facility] Emergency Department Provider Note  ____________________________________________  Time seen: Approximately 9:50 PM  I have reviewed the triage vital signs and the nursing notes.   HISTORY  Chief Complaint Asthma and Fever    HPI Carla Nichols is a 29 y.o. female who presents emergency department complaining of ongoing cough, wheezing x1 month.  Patient states that she has had off-and-on symptoms for a month.  Patient had been evaluated by primary care 2 weeks ago, negative chest x-ray and swab.  Patient seemed to be improving and then she had a close contact with someone with RSV.  Patient has had fever for the last 3 days, consistent wheezing and chest tightness over the last 3 days.  No chest pain.  No frank difficulty breathing.  Patient has denied any nasal congestion, sore throat.  Patient does have a history of asthma, does have both albuterol inhaler as well as an nebulizer machine.  She has been using the nebulized albuterol without improvement.         Past Medical History:  Diagnosis Date  . Asthma    pulmicort and albuterol inhaler as needed  . Diabetes mellitus without complication ([facility])    type 2 diet controlled  . Dyspnea   . Hypertension    no meds  . Seizures ([facility])    as a child    Patient Active Problem List   Diagnosis Date Noted  . Exacerbation of asthma 08/26/2017  . Pneumonia 08/26/2017    Past Surgical History:  Procedure Laterality Date  . DILATION AND EVACUATION N/A 01/24/2016   Procedure: DILATATION AND EVACUATION;  Surgeon: Nadara Mustard, MD;  Location: [facility] ORS;  Service: Gynecology;  Laterality: N/A;  . TONSILLECTOMY    . WISDOM TOOTH EXTRACTION      Prior to Admission medications   Medication Sig Start Date End Date Taking? Authorizing Provider  albuterol (PROVENTIL) (2.5 MG/3ML) 0.083% nebulizer solution Take 3 mLs (2.5 mg total) by nebulization every 4 (four) hours as needed for wheezing or  shortness of breath. 10/31/16   Pricilla Loveless, MD  guaiFENesin (MUCINEX) 600 MG 12 hr tablet Take 1 tablet (600 mg total) by mouth 2 (two) times daily. Patient not taking: Reported on 11/01/2018 08/28/17   Alwyn Ren, MD  ipratropium-albuterol (DUONEB) 0.5-2.5 (3) MG/3ML SOLN Take 3 mLs by nebulization every 2 (two) hours as needed (wheezing and shortness of breath). 03/29/20   Brylie Sneath, Delorise Royals, PA-C  metroNIDAZOLE (FLAGYL) 500 MG tablet Take 1 tablet (500 mg total) by mouth 2 (two) times daily. 11/03/18   Armando Reichert, CNM  predniSONE (DELTASONE) 50 MG tablet Take 1 tablet (50 mg total) by mouth daily with breakfast. 03/29/20   Latresa Gasser, Delorise Royals, PA-C  terconazole (TERAZOL 7) 0.4 % vaginal cream Place 1 applicator vaginally at bedtime. 11/03/18   Thressa Sheller D, CNM    Allergies Bee venom and Shellfish allergy  Family History  Problem Relation Age of Onset  . Diabetes Mother   . Arthritis Mother   . Hypertension Father   . Arthritis Father   . Diabetes Maternal Grandmother   . Diabetes Paternal Grandmother   . Hypertension Paternal Grandmother     Social History Social History   Tobacco Use  . Smoking status: Current Some Day Smoker    Last attempt to quit: 12/21/2015    Years since quitting: 4.2  . Smokeless tobacco: Never Used  Substance Use Topics  . Alcohol use: No  .  Drug use: No     Review of Systems  Constitutional: Positive fever/chills Eyes: No visual changes. No discharge ENT: No upper respiratory complaints. Cardiovascular: no chest pain. Respiratory: Positive for cough, shortness of breath, chest tightness Gastrointestinal: No abdominal pain.  No nausea, no vomiting.  No diarrhea.  No constipation. Musculoskeletal: Negative for musculoskeletal pain. Skin: Negative for rash, abrasions, lacerations, ecchymosis. Neurological: Negative for headaches, focal weakness or numbness.  10 System ROS otherwise  negative.  ____________________________________________   PHYSICAL EXAM:  VITAL SIGNS: ED Triage Vitals [03/29/20 1756]  Enc Vitals Group     BP 139/79     Pulse Rate (!) 113     Resp 20     Temp (!) 101.9 F (38.8 C)     Temp Source Oral     SpO2 97 %     Weight (!) 330 lb (149.7 kg)     Height 5\' 3"  (1.6 m)     Head Circumference      Peak Flow      Pain Score 8     Pain Loc      Pain Edu?      Excl. in GC?      Constitutional: Alert and oriented. Well appearing and in no acute distress. Eyes: Conjunctivae are normal. PERRL. EOMI. Head: Atraumatic. ENT:      Ears:       Nose: No congestion/rhinnorhea.      Mouth/Throat: Mucous membranes are moist.  Neck: No stridor.    Cardiovascular: Normal rate, regular rhythm. Normal S1 and S2.  Good peripheral circulation. Respiratory: Normal respiratory effort without tachypnea or retractions. Lungs with mild expiratory wheeze in the bilateral lower lung fields.  No rales or rhonchi.  No crackles.  No inspiratory wheezing. air entry to the bases with no decreased or absent breath sounds. Musculoskeletal: Full range of motion to all extremities. No gross deformities appreciated. Neurologic:  Normal speech and language. No gross focal neurologic deficits are appreciated.  Skin:  Skin is warm, dry and intact. No rash noted. Psychiatric: Mood and affect are normal. Speech and behavior are normal. Patient exhibits appropriate insight and judgement.   ____________________________________________   LABS (all labs ordered are listed, but only abnormal results are displayed)  Labs Reviewed - No data to display ____________________________________________  EKG   ____________________________________________  RADIOLOGY I personally viewed and evaluated these images as part of my medical decision making, as well as reviewing the written report by the radiologist.  ED Provider Interpretation: I agree with findings of  peribronchial thickening consistent with bronchitis.  No consolidation concerning for pneumonia.  DG Chest 2 View  Result Date: 03/29/2020 CLINICAL DATA:  One month of coughing EXAM: CHEST - 2 VIEW COMPARISON:  None. FINDINGS: The heart size and mediastinal contours are within normal limits. Mildly increased bronchial wall thickening seen within the right middle lobe. The visualized skeletal structures are unremarkable. IMPRESSION: Increased bronchial markings within the right middle lobe which could be due to bronchitis. Electronically Signed   By: 14/12/2019 M.D.   On: 03/29/2020 21:14    ____________________________________________    PROCEDURES  Procedure(s) performed:    Procedures    Medications  acetaminophen (TYLENOL) tablet 650 mg (650 mg Oral Given 03/29/20 1802)  albuterol (PROVENTIL) (2.5 MG/3ML) 0.083% nebulizer solution 5 mg (5 mg Nebulization Given 03/29/20 1802)  ipratropium-albuterol (DUONEB) 0.5-2.5 (3) MG/3ML nebulizer solution 3 mL (3 mLs Nebulization Given 03/29/20 2148)  methylPREDNISolone sodium succinate (SOLU-MEDROL) 125 mg/2  mL injection 125 mg (125 mg Intramuscular Given 03/29/20 2148)     ____________________________________________   INITIAL IMPRESSION / ASSESSMENT AND PLAN / ED COURSE  Pertinent labs & imaging results that were available during my care of the patient were reviewed by me and considered in my medical decision making (see chart for details).  Review of the [place] CSRS was performed in accordance of the [facility] prior to dispensing any controlled drugs.           Patient's diagnosis is consistent with viral bronchitis.  Patient presented to emergency department complaining of cough, fever.  She has had ongoing asthma symptoms x1 month.  Was evaluated 2 weeks ago with negative testing.  Patient is already had an influenza, Covid swab earlier today and is awaiting results.  Chest x-ray performed this evening revealed findings consistent with  bronchitis, likely viral.  Patient feels much improved after Solu-Medrol and 2 DuoNeb treatments.  Lung sounds improved and I will discharge the patient with DuoNeb vials for her nebulizer and prednisone.. . Patient is given ED precautions to return to the ED for any worsening or new symptoms.     ____________________________________________  FINAL CLINICAL IMPRESSION(S) / ED DIAGNOSES  Final diagnoses:  Bronchitis      NEW MEDICATIONS STARTED DURING THIS VISIT:  ED Discharge Orders         Ordered    ipratropium-albuterol (DUONEB) 0.5-2.5 (3) MG/3ML SOLN  Every 2 hours PRN        03/29/20 2154    predniSONE (DELTASONE) 50 MG tablet  Daily with breakfast        03/29/20 2154              This chart was dictated using voice recognition software/Dragon. Despite best efforts to proofread, errors can occur which can change the meaning. Any change was purely unintentional.    Racheal Patches, PA-C 03/29/20 2154    Chesley Noon, MD 03/29/20 [phone]

## 2020-03-29 NOTE — ED Triage Notes (Signed)
Pt comes into the ED via POV c/o problems with her asthma and having fevers at home.  Pt is using her inhalers with no relief and has been having this problem ongoing x 1 month. Pt currently has even and unlabored respirations at this time and is in NAD.

## 2021-06-04 ENCOUNTER — Other Ambulatory Visit: Payer: Self-pay

## 2021-06-04 ENCOUNTER — Encounter (HOSPITAL_COMMUNITY): Payer: Self-pay | Admitting: *Deleted

## 2021-06-04 ENCOUNTER — Emergency Department (HOSPITAL_COMMUNITY)
Admission: EM | Admit: 2021-06-04 | Discharge: 2021-06-04 | Disposition: A | Discharge status: Home / Self Care | Payer: Medicaid Other | Attending: Emergency Medicine | Admitting: Emergency Medicine

## 2021-06-04 ENCOUNTER — Emergency Department (HOSPITAL_COMMUNITY): Payer: Medicaid Other

## 2021-06-04 DIAGNOSIS — K2920 Alcoholic gastritis without bleeding: Secondary | ICD-10-CM | POA: Insufficient documentation

## 2021-06-04 DIAGNOSIS — R1013 Epigastric pain: Secondary | ICD-10-CM | POA: Diagnosis present

## 2021-06-04 LAB — CBC
HCT: 45.2 % (ref 36.0–46.0)
Hemoglobin: 14.8 g/dL (ref 12.0–15.0)
MCH: 29.9 pg (ref 26.0–34.0)
MCHC: 32.7 g/dL (ref 30.0–36.0)
MCV: 91.3 fL (ref 80.0–100.0)
Platelets: 236 10*3/uL (ref 150–400)
RBC: 4.95 MIL/uL (ref 3.87–5.11)
RDW: 13.5 % (ref 11.5–15.5)
WBC: 9.9 10*3/uL (ref 4.0–10.5)
nRBC: 0 % (ref 0.0–0.2)

## 2021-06-04 LAB — I-STAT BETA HCG BLOOD, ED (MC, WL, AP ONLY): I-stat hCG, quantitative: <5 m[IU]/mL (ref <5)

## 2021-06-04 LAB — COMPREHENSIVE METABOLIC PANEL
ALT: 18 U/L (ref 0–44)
AST: 17 U/L (ref 15–41)
Albumin: 3.6 g/dL (ref 3.5–5.0)
Alkaline Phosphatase: 59 U/L (ref 38–126)
Anion gap: 10 (ref 5–15)
BUN: 11 mg/dL (ref 6–20)
CO2: 24 mmol/L (ref 22–32)
Calcium: 9.2 mg/dL (ref 8.9–10.3)
Chloride: 104 mmol/L (ref 98–111)
Creatinine, Ser: 0.65 mg/dL (ref 0.44–1.00)
GFR, Estimated: >60 mL/min (ref >60)
Glucose, Bld: 107 mg/dL — ABNORMAL HIGH (ref 70–99)
Potassium: 4.1 mmol/L (ref 3.5–5.1)
Sodium: 138 mmol/L (ref 135–145)
Total Bilirubin: 0.4 mg/dL (ref 0.3–1.2)
Total Protein: 7.8 g/dL (ref 6.5–8.1)

## 2021-06-04 LAB — LIPASE, BLOOD: Lipase: 29 U/L (ref 11–51)

## 2021-06-04 MED ORDER — SODIUM CHLORIDE 0.9 % IV BOLUS
1000.0000 mL | Freq: Once | INTRAVENOUS | Status: AC
  Administered 2021-06-04: 1000 mL via INTRAVENOUS

## 2021-06-04 MED ORDER — ONDANSETRON 4 MG PO TBDP: 4.0000 mg | ORAL_TABLET | Freq: Three times a day (TID) | ORAL | 0 refills | Status: DC | PRN

## 2021-06-04 MED ORDER — FAMOTIDINE 20 MG PO TABS: 20.0000 mg | ORAL_TABLET | Freq: Two times a day (BID) | ORAL | 0 refills | Status: DC

## 2021-06-04 MED ORDER — FAMOTIDINE IN NACL 20-0.9 MG/50ML-% IV SOLN
20.0000 mg | Freq: Once | INTRAVENOUS | Status: AC
Start: 2021-06-04 — End: 2021-06-04
  Administered 2021-06-04: 20 mg via INTRAVENOUS
  Filled 2021-06-04: qty 50

## 2021-06-04 MED ORDER — ONDANSETRON HCL 4 MG/2ML IJ SOLN
4.0000 mg | Freq: Once | INTRAMUSCULAR | Status: AC
  Administered 2021-06-04: 4 mg via INTRAVENOUS
  Filled 2021-06-04: qty 2

## 2021-06-04 NOTE — ED Provider Notes (Signed)
[facility]PT [facility] Emergency Department Provider Note MRN:  [id number]  Arrival date & time: 06/04/21     Chief Complaint   Abdominal Pain   History of Present Illness   Enna Warwick is a 31 y.o. year-old female presents to the ED with chief complaint of epigastric abdominal pain that started last night.  She states that she had been drinking over the weekend.  She reports associated vomiting.  Denies any fevers, chills, or cough.  She denies prior abdominal surgeries.  States that the pain has been gradually worsening.    Review of Systems  Pertinent review of systems noted in HPI.    Physical Exam   Vitals:   06/04/21 0647 06/04/21 0843  BP: (!) 162/74 (!) 150/106  Pulse: 72 (!) 56  Resp: 20 18  Temp: 98 F (36.7 C)   SpO2: 98% 100%    CONSTITUTIONAL:  well-appearing, NAD NEURO:  Alert and oriented x 3, CN 3-12 grossly intact EYES:  eyes equal and reactive ENT/NECK:  Supple, no stridor  CARDIO:  normal rate, regular rhythm, appears well-perfused  PULM:  No respiratory distress, CTAB GI/GU:  non-distended, moderate epigastric tenderness, no RUQ tenderness MSK/SPINE:  No gross deformities, no edema, moves all extremities  SKIN:  no rash, atraumatic   *Additional and/or pertinent findings included in MDM below  Diagnostic and Interventional Summary    EKG Interpretation  Date/Time:    Ventricular Rate:    PR Interval:    QRS Duration:   QT Interval:    QTC Calculation:   R Axis:     Text Interpretation:         Labs Reviewed  COMPREHENSIVE METABOLIC PANEL - Abnormal; Notable for the following components:      Result Value   Glucose, Bld 107 (*)    All other components within normal limits  LIPASE, BLOOD  CBC  URINALYSIS, ROUTINE W REFLEX MICROSCOPIC  I-STAT BETA HCG BLOOD, ED ([facility], [facility], [facility] ONLY)    US Abdomen Limited  Final Result      Medications  sodium chloride 0.9 % bolus 1,000 mL (1,000 mLs Intravenous New Bag/Given  06/04/21 0829)  ondansetron (ZOFRAN) injection 4 mg (4 mg Intravenous Given 06/04/21 0827)  famotidine (PEPCID) IVPB 20 mg premix (20 mg Intravenous New Bag/Given 06/04/21 1324)     Procedures  /  Critical Care Procedures  ED Course and Medical Decision Making  I have reviewed the triage vital signs, the nursing notes, and pertinent available records from the EMR.  Complexity of Problems Addressed Acute complicated illness or Injury  Additional Data Reviewed and Analyzed Further history obtained from: Past medical history and medications listed in the EMR and Prior ED visit notes    ED Course    I personally reviewed the I reviewed the labs which are notable for normal lipase and normal LFTs, doubt pancreatitis or hepatitis and I reviewed the [place] which is notable for no signs of cholecystitis and agree with radiologist interpretation.  Patient feeling improved after treatment with IV Pepcid and IV Zofran in the emergency department.  Will discharge home with p.o. versions of the same.  Advised patient to have a bland diet for the next few days and avoid alcohol.  Patient understands and agrees with the plan.     Final Clinical Impressions(s) / ED Diagnoses     ICD-10-CM   1. Acute alcoholic gastritis without hemorrhage  K29.20       ED Discharge Orders  Ordered    famotidine (PEPCID) 20 MG tablet  2 times daily        06/04/21 1021    ondansetron (ZOFRAN-ODT) 4 MG disintegrating tablet  Every 8 hours PRN        06/04/21 1021             Discharge Instructions Discussed with and Provided to Patient:   Discharge Instructions   None      Roxy Horseman, PA-C 06/04/21 1023    Milagros Loll, MD 06/06/21 2118

## 2021-06-04 NOTE — ED Triage Notes (Signed)
Upper midline abdominal pain started yesterday, nausea, vomiting, diarrhea.

## 2022-05-01 ENCOUNTER — Ambulatory Visit
Admission: EM | Admit: 2022-05-01 | Discharge: 2022-05-01 | Disposition: A | Discharge status: Home / Self Care | Payer: Medicaid Other | Attending: Family Medicine | Admitting: Family Medicine

## 2022-05-01 DIAGNOSIS — J111 Influenza due to unidentified influenza virus with other respiratory manifestations: Secondary | ICD-10-CM

## 2022-05-01 DIAGNOSIS — J4521 Mild intermittent asthma with (acute) exacerbation: Secondary | ICD-10-CM | POA: Diagnosis not present

## 2022-05-01 LAB — POCT INFLUENZA A/B
Influenza A, POC: POSITIVE — AB
Influenza B, POC: NEGATIVE

## 2022-05-01 LAB — POC SARS CORONAVIRUS 2 AG -  ED: SARS Coronavirus 2 Ag: NEGATIVE

## 2022-05-01 MED ORDER — IPRATROPIUM-ALBUTEROL 0.5-2.5 (3) MG/3ML IN SOLN: 3.0000 mL | RESPIRATORY_TRACT | 0 refills | Status: AC | PRN

## 2022-05-01 MED ORDER — ALBUTEROL SULFATE (2.5 MG/3ML) 0.083% IN NEBU: 2.5000 mg | INHALATION_SOLUTION | RESPIRATORY_TRACT | 0 refills | Status: DC | PRN

## 2022-05-01 MED ORDER — OSELTAMIVIR PHOSPHATE 75 MG PO CAPS: 75.0000 mg | ORAL_CAPSULE | Freq: Two times a day (BID) | ORAL | 0 refills | Status: DC

## 2022-05-01 NOTE — ED Triage Notes (Signed)
Pt c/o cough and congestion since yesterday morning. Chills and bodyaches last night. No thermometer at home so doesn't know if she's been febrile. Taking ibuprofen and nyquil prn.

## 2022-05-01 NOTE — Discharge Instructions (Signed)
Take Tamiflu 2 times a day for 5 days Take Ibuprofen for pain I have refilled your albuterol

## 2022-05-01 NOTE — ED Provider Notes (Signed)
[facility]    CSN: [id number] Arrival date & time: 05/01/22  4259      History   Chief Complaint Chief Complaint  Patient presents with   Cough   Nasal Congestion    HPI Carla Nichols is a 32 y.o. female.   HPI  Patient does have underlying allergies.  She is here for an upper respiratory infection.  She has a headache and body aches, chills.  She states she is having coughing and some chest tightness.  No sore throat or ear pressure.  No known exposure to illness    Past Medical History:  Diagnosis Date   Asthma    pulmicort and albuterol inhaler as needed   Diabetes mellitus without complication ([place])    type 2 diet controlled   Dyspnea    Hypertension    no meds   Seizures ([place])    as a child    Patient Active Problem List   Diagnosis Date Noted   Exacerbation of asthma 08/26/2017   Pneumonia 08/26/2017    Past Surgical History:  Procedure Laterality Date   DILATION AND EVACUATION N/A 01/24/2016   Procedure: DILATATION AND EVACUATION;  Surgeon: Gae Dry, MD;  Location: [facility] ORS;  Service: Gynecology;  Laterality: N/A;   TONSILLECTOMY     WISDOM TOOTH EXTRACTION      OB History     Gravida  2   Para  1   Term  1   Preterm  0   AB  1   Living  1      SAB  0   IAB  0   Ectopic  0   Multiple  0   Live Births  1            Home Medications    Prior to Admission medications   Medication Sig Start Date End Date Taking? Authorizing Provider  oseltamivir (TAMIFLU) 75 MG capsule Take 1 capsule (75 mg total) by mouth every 12 (twelve) hours. 05/01/22  Yes Raylene Everts, MD  albuterol (PROVENTIL) (2.5 MG/3ML) 0.083% nebulizer solution Take 3 mLs (2.5 mg total) by nebulization every 4 (four) hours as needed for wheezing or shortness of breath. 05/01/22   Raylene Everts, MD  famotidine (PEPCID) 20 MG tablet Take 1 tablet (20 mg total) by mouth 2 (two) times daily. 06/04/21   Montine Circle, PA-C   ipratropium-albuterol (DUONEB) 0.5-2.5 (3) MG/3ML SOLN Take 3 mLs by nebulization every 2 (two) hours as needed (wheezing and shortness of breath). 05/01/22   Raylene Everts, MD    Family History Family History  Problem Relation Age of Onset   Diabetes Mother    Arthritis Mother    Hypertension Father    Arthritis Father    Diabetes Maternal Grandmother    Diabetes Paternal Grandmother    Hypertension Paternal Grandmother     Social History Social History   Tobacco Use   Smoking status: Some Days    Types: Cigarettes    Last attempt to quit: 12/21/2015    Years since quitting: 6.3   Smokeless tobacco: Never  Substance Use Topics   Alcohol use: No   Drug use: No     Allergies   Bee venom and Shellfish allergy   Review of Systems Review of Systems See HPI  Physical Exam Triage Vital Signs ED Triage Vitals  Enc Vitals Group     BP 05/01/22 0824 (!) 166/74     Pulse Rate  05/01/22 0824 96     Resp 05/01/22 0824 18     Temp 05/01/22 0824 98.3 F (36.8 C)     Temp Source 05/01/22 0824 Oral     SpO2 05/01/22 0824 97 %     Weight --      Height --      Head Circumference --      Peak Flow --      Pain Score 05/01/22 0826 0     Pain Loc --      Pain Edu? --      Excl. in [place]? --    No data found.  Updated Vital Signs BP (!) 166/74 (BP Location: Right Wrist)   Pulse 96   Temp 98.3 F (36.8 C) (Oral)   Resp 18   SpO2 97%      Physical Exam Constitutional:      General: She is not in acute distress.    Appearance: She is well-developed. She is obese. She is ill-appearing.  HENT:     Head: Normocephalic and atraumatic.     Right Ear: Tympanic membrane and ear canal normal.     Left Ear: Tympanic membrane and ear canal normal.     Nose: Nose normal. No congestion.     Mouth/Throat:     Mouth: Mucous membranes are moist.     Pharynx: No posterior oropharyngeal erythema.  Eyes:     Conjunctiva/sclera: Conjunctivae normal.     Pupils: Pupils are  equal, round, and reactive to light.  Cardiovascular:     Rate and Rhythm: Normal rate and regular rhythm.     Heart sounds: Normal heart sounds.  Pulmonary:     Effort: Pulmonary effort is normal. No respiratory distress.     Breath sounds: Wheezing and rhonchi present.     Comments: Few scattered wheezing and rhonchi Abdominal:     General: There is no distension.     Palpations: Abdomen is soft.  Musculoskeletal:        General: Normal range of motion.     Cervical back: Normal range of motion and neck supple.  Lymphadenopathy:     Cervical: No cervical adenopathy.  Skin:    General: Skin is warm and dry.  Neurological:     Mental Status: She is alert.      UC Treatments / Results  Labs (all labs ordered are listed, but only abnormal results are displayed) Labs Reviewed  POCT INFLUENZA A/B - Abnormal; Notable for the following components:      Result Value   Influenza A, POC Positive (*)    All other components within normal limits  POC SARS CORONAVIRUS 2 AG -  ED    EKG   Radiology No results found.  Procedures Procedures (including critical care time)  Medications Ordered in UC Medications - No data to display  Initial Impression / Assessment and Plan / UC Course  I have reviewed the triage vital signs and the nursing notes.  Pertinent labs & imaging results that were available during my care of the patient were reviewed by me and considered in my medical decision making (see chart for details).     Discussed influenza management.  Prevention for family members.  Reasons for returning Final Clinical Impressions(s) / UC Diagnoses   Final diagnoses:  Influenza with respiratory manifestation  Exacerbation of intermittent asthma, unspecified asthma severity     Discharge Instructions      Take Tamiflu 2 times a day for  5 days Take Ibuprofen for pain I have refilled your albuterol      ED Prescriptions     Medication Sig Dispense Auth.  Provider   oseltamivir (TAMIFLU) 75 MG capsule Take 1 capsule (75 mg total) by mouth every 12 (twelve) hours. 10 capsule Eustace Moore, MD   ipratropium-albuterol (DUONEB) 0.5-2.5 (3) MG/3ML SOLN Take 3 mLs by nebulization every 2 (two) hours as needed (wheezing and shortness of breath). 45 mL Eustace Moore, MD      PDMP not reviewed this encounter.   Eustace Moore, MD 05/01/22 [phone]

## 2023-02-09 ENCOUNTER — Encounter: Payer: Self-pay | Admitting: Family Medicine

## 2023-02-09 ENCOUNTER — Ambulatory Visit: Payer: Medicaid Other | Admitting: Family Medicine

## 2023-02-09 VITALS — BP 146/92 | HR 71 | Temp 97.7°F | Ht 63.0 in | Wt 353.8 lb

## 2023-02-09 DIAGNOSIS — R002 Palpitations: Secondary | ICD-10-CM | POA: Diagnosis not present

## 2023-02-09 DIAGNOSIS — E119 Type 2 diabetes mellitus without complications: Secondary | ICD-10-CM | POA: Insufficient documentation

## 2023-02-09 DIAGNOSIS — I10 Essential (primary) hypertension: Secondary | ICD-10-CM | POA: Insufficient documentation

## 2023-02-09 DIAGNOSIS — N920 Excessive and frequent menstruation with regular cycle: Secondary | ICD-10-CM | POA: Insufficient documentation

## 2023-02-09 MED ORDER — LISINOPRIL 20 MG PO TABS: 20.0000 mg | ORAL_TABLET | Freq: Every day | ORAL | 2 refills | Status: DC

## 2023-02-09 NOTE — Assessment & Plan Note (Signed)
Pt notes feeling heart beat sometimes. We discussed that this could be related to uncontrolled htn but I would like to investigate thyroid levels on blood work today - if labs are normal we will see if symptoms resolve after being treated for HTN, if not, we will further investigate with zio patch monitoring

## 2023-02-09 NOTE — Assessment & Plan Note (Signed)
-   bp elevated today at 146/92 and due to pt hx of htn we will go ahead and start on medicine picking acei due to renal protection - did educate patient on side effects

## 2023-02-09 NOTE — Assessment & Plan Note (Signed)
Pt notes severe fatigue and heavy periods. Will check iron levels to rule out anemia

## 2023-02-09 NOTE — Assessment & Plan Note (Signed)
-   pmh but hasn't had A1c done in over 10 years - will order A1c along with CMP to assess for kidney function and see if we are eligible to starting metformin and mounjaro  - ophtho referral placed to assess for diabetic retinopathy

## 2023-02-09 NOTE — Progress Notes (Signed)
New patient visit   Patient: Carla Nichols   DOB: 07/19/1990   32 y.o. Female  MRN: [id number] Visit Date: 02/09/2023  Today's healthcare provider: Charlton Amor, DO   Chief Complaint  Patient presents with   New Patient (Initial Visit)    Establish Care    SUBJECTIVE    Chief Complaint  Patient presents with   New Patient (Initial Visit)    Establish Care   HPI HPI     New Patient (Initial Visit)    Additional comments: Establish Care      Last edited by Roselyn Reef, CMA on 02/09/2023  9:21 AM.      Pt presents to establish care.   Pmh significant for HTN. Was on medicine in the past but is not on medication right now  Has a pmh of diabetes - not currently on medicine  Obesity  - is concerned about her weight.  - BMI 62  Review of Systems  Constitutional:  Negative for activity change, fatigue and fever.  Respiratory:  Negative for cough and shortness of breath.   Cardiovascular:  Negative for chest pain.  Gastrointestinal:  Negative for abdominal pain.  Genitourinary:  Negative for difficulty urinating.       Current Meds  Medication Sig   lisinopril (ZESTRIL) 20 MG tablet Take 1 tablet (20 mg total) by mouth daily.    OBJECTIVE    BP (!) 146/92 (BP Location: Left Arm, Patient Position: Sitting, Cuff Size: Large)   Pulse 71   Temp 97.7 F (36.5 C)   Ht 5\' 3"  (1.6 m)   Wt (!) 353 lb 12 oz (160.5 kg)   SpO2 97%   BMI 62.66 kg/m   Physical Exam Vitals and nursing note reviewed.  Constitutional:      General: She is not in acute distress.    Appearance: Normal appearance.  HENT:     Head: Normocephalic and atraumatic.     Right Ear: External ear normal.     Left Ear: External ear normal.     Nose: Nose normal.  Eyes:     Conjunctiva/sclera: Conjunctivae normal.  Cardiovascular:     Rate and Rhythm: Normal rate and regular rhythm.  Pulmonary:     Effort: Pulmonary effort is normal.     Breath sounds: Normal breath  sounds.  Musculoskeletal:     Comments: R knee crepitus on exam  Skin:    Comments: Acanthos nigrans noted around neck  Neurological:     General: No focal deficit present.     Mental Status: She is alert and oriented to person, place, and time.  Psychiatric:        Mood and Affect: Mood normal.        Behavior: Behavior normal.        Thought Content: Thought content normal.        Judgment: Judgment normal.        ASSESSMENT & PLAN    Problem List Items Addressed This Visit       Cardiovascular and Mediastinum   Primary hypertension    - bp elevated today at 146/92 and due to pt hx of htn we will go ahead and start on medicine picking acei due to renal protection - did educate patient on side effects      Relevant Medications   lisinopril (ZESTRIL) 20 MG tablet   Other Relevant Orders   CBC   Lipid panel     Endocrine  Type 2 diabetes mellitus without complication, without long-term current use of insulin (HCC) - Primary    - pmh but hasn't had A1c done in over 10 years - will order A1c along with CMP to assess for kidney function and see if we are eligible to starting metformin and mounjaro  - ophtho referral placed to assess for diabetic retinopathy       Relevant Medications   lisinopril (ZESTRIL) 20 MG tablet   Other Relevant Orders   Ambulatory referral to Ophthalmology   CBC   CMP14+EGFR   HgB A1c   Lipid panel   Microalbumin / creatinine urine ratio     Other   Palpitations    Pt notes feeling heart beat sometimes. We discussed that this could be related to uncontrolled htn but I would like to investigate thyroid levels on blood work today - if labs are normal we will see if symptoms resolve after being treated for HTN, if not, we will further investigate with zio patch monitoring      Relevant Orders   Fe+TIBC+Fer   TSH + free T4   Menorrhagia with regular cycle    Pt notes severe fatigue and heavy periods. Will check iron levels to rule out  anemia      Relevant Orders   Fe+TIBC+Fer    Return in about 4 weeks (around 03/09/2023) for HTN check and DM .      Meds ordered this encounter  Medications   lisinopril (ZESTRIL) 20 MG tablet    Sig: Take 1 tablet (20 mg total) by mouth daily.    Dispense:  30 tablet    Refill:  2    Orders Placed This Encounter  Procedures   CBC   CMP14+EGFR    Order Specific Question:   Has the patient fasted?    Answer:   No   Fe+TIBC+Fer   HgB A1c   Lipid panel    Order Specific Question:   Has the patient fasted?    Answer:   No    Order Specific Question:   Release to patient    Answer:   Immediate   Microalbumin / creatinine urine ratio   TSH + free T4   Ambulatory referral to Ophthalmology    Referral Priority:   Routine    Referral Type:   Consultation    Referral Reason:   Specialty Services Required    Requested Specialty:   Ophthalmology    Number of Visits Requested:   1     Charlton Amor, DO  [facility] at [facility] [phone] (phone) [phone] (fax)  [facility]

## 2023-02-11 ENCOUNTER — Other Ambulatory Visit: Payer: Self-pay | Admitting: Family Medicine

## 2023-02-11 DIAGNOSIS — E66813 Obesity, class 3: Secondary | ICD-10-CM | POA: Insufficient documentation

## 2023-02-11 LAB — IRON,TIBC AND FERRITIN PANEL
Ferritin: 111 ng/mL (ref 15–150)
Iron Saturation: 25 % (ref 15–55)
Iron: 69 ug/dL (ref 27–159)
Total Iron Binding Capacity: 281 ug/dL (ref 250–450)
UIBC: 212 ug/dL (ref 131–425)

## 2023-02-11 LAB — MICROALBUMIN / CREATININE URINE RATIO
Creatinine, Urine: 86.3 mg/dL
Microalb/Creat Ratio: 12 mg/g{creat} (ref 0–29)
Microalbumin, Urine: 10.1 ug/mL

## 2023-02-11 LAB — CBC
Hematocrit: 44.8 % (ref 34.0–46.6)
Hemoglobin: 14.2 g/dL (ref 11.1–15.9)
MCH: 29 pg (ref 26.6–33.0)
MCHC: 31.7 g/dL (ref 31.5–35.7)
MCV: 91 fL (ref 79–97)
Platelets: 237 10*3/uL (ref 150–450)
RBC: 4.9 x10E6/uL (ref 3.77–5.28)
RDW: 12.7 % (ref 11.7–15.4)
WBC: 8.3 10*3/uL (ref 3.4–10.8)

## 2023-02-11 LAB — CMP14+EGFR
ALT: 17 IU/L (ref 0–32)
AST: 14 IU/L (ref 0–40)
Albumin: 3.9 g/dL — ABNORMAL LOW (ref 4.0–5.0)
Alkaline Phosphatase: 68 IU/L (ref 44–121)
BUN/Creatinine Ratio: 13 (ref 9–23)
BUN: 10 mg/dL (ref 6–20)
Bilirubin Total: 0.3 mg/dL (ref 0.0–1.2)
CO2: 23 mmol/L (ref 20–29)
Calcium: 9.2 mg/dL (ref 8.7–10.2)
Chloride: 106 mmol/L (ref 96–106)
Creatinine, Ser: 0.78 mg/dL (ref 0.57–1.00)
Globulin, Total: 3 g/dL (ref 1.5–4.5)
Glucose: 74 mg/dL (ref 70–99)
Potassium: 5.1 mmol/L (ref 3.5–5.2)
Sodium: 141 mmol/L (ref 134–144)
Total Protein: 6.9 g/dL (ref 6.0–8.5)
eGFR: 105 mL/min/{1.73_m2} (ref >59)

## 2023-02-11 LAB — LIPID PANEL
Chol/HDL Ratio: 4 ratio (ref 0.0–4.4)
Cholesterol, Total: 131 mg/dL (ref 100–199)
HDL: 33 mg/dL — ABNORMAL LOW (ref >39)
LDL Chol Calc (NIH): 65 mg/dL (ref 0–99)
Triglycerides: 198 mg/dL — ABNORMAL HIGH (ref 0–149)
VLDL Cholesterol Cal: 33 mg/dL (ref 5–40)

## 2023-02-11 LAB — TSH+FREE T4
Free T4: 1.29 ng/dL (ref 0.82–1.77)
TSH: 1.03 u[IU]/mL (ref 0.450–4.500)

## 2023-02-11 LAB — HEMOGLOBIN A1C
Est. average glucose Bld gHb Est-mCnc: 120 mg/dL
Hgb A1c MFr Bld: 5.8 % — ABNORMAL HIGH (ref 4.8–5.6)

## 2023-02-11 MED ORDER — WEGOVY 0.25 MG/0.5ML ~~LOC~~ SOAJ
0.2500 mg | SUBCUTANEOUS | 0 refills | Status: DC
  Filled 2023-03-10: qty 2, 28d supply, fill #0

## 2023-02-12 ENCOUNTER — Ambulatory Visit: Payer: Medicaid Other | Admitting: Family Medicine

## 2023-02-12 ENCOUNTER — Telehealth: Payer: Self-pay | Admitting: Family Medicine

## 2023-02-12 NOTE — Telephone Encounter (Signed)
Patient called she went to pharmacy to pick up Wegovy 0.25 mg and they don't have a record of it does she require a PA ?   [facility] in [place]  [phone]

## 2023-02-13 ENCOUNTER — Other Ambulatory Visit: Payer: Self-pay | Admitting: Family Medicine

## 2023-02-13 ENCOUNTER — Other Ambulatory Visit: Payer: Self-pay

## 2023-02-13 ENCOUNTER — Telehealth: Payer: Self-pay | Admitting: Family Medicine

## 2023-02-13 MED ORDER — METFORMIN HCL ER 500 MG PO TB24: 500.0000 mg | ORAL_TABLET | Freq: Two times a day (BID) | ORAL | 3 refills | Status: DC

## 2023-02-13 NOTE — Telephone Encounter (Signed)
Spoke with the pt and she would like to try the metformin, and is going to reach back out to pharmacy to see if it needs an PA. Roselyn Reef, CMA

## 2023-02-13 NOTE — Telephone Encounter (Signed)
Patient called stating the pharmacy still has not received her prescription of WEGOVY 0.25 MG/0.5ML SOAJ [[id number]] Please advise.

## 2023-03-03 NOTE — Progress Notes (Signed)
   03/03/2023  Patient ID: Carla Nichols, female   DOB: 05/17/1990, 32 y.o.   MRN: [id number]  Clinic routed request from patient's PCP, Dr. Morey Hummingbird, to assist with access to [facility] 0.5mg  weekly  -Carla Nichols was prescribed 02/11/23, and patient states she has not received the prescription yet -[facility] pharmacy, and they did not receive the e prescription sent on 10/23; but I verbally gave them all information, so this could go into process -Patient's plan requires a prior authorization for [facility] -Patient is contacting insurance or pharmacy to get processing information for her Medicaid plan (card not on file in chart), so I can submit PA request to insurance  Lenna Gilford, PharmD, DPLA

## 2023-03-05 NOTE — Progress Notes (Signed)
   03/05/2023  Patient ID: Carla Nichols, female   DOB: 01-02-1991, 32 y.o.   MRN: [id number]  Prior authorization for [facility] 0.25mg  weekly has been approved per CoverMyMeds.  Contacted pharmacy to process prescription, and they are able to get a paid claim on the patient's insurance.  Of note, insurance has her name listed as Alfonse Flavors, which will affect claims if pharmacy chart does not reflect exactly.  Pharmacy does not have this strength in stock and may not receive until the end of this month.  Sending patient a MyChart message to inform and suggest calling to see if any other local pharmacy has in stock, so prescription can be transferred.  Lenna Gilford, PharmD, DPLA

## 2023-03-09 ENCOUNTER — Encounter: Payer: Self-pay | Admitting: Family Medicine

## 2023-03-09 ENCOUNTER — Ambulatory Visit (INDEPENDENT_AMBULATORY_CARE_PROVIDER_SITE_OTHER): Payer: Medicaid Other | Admitting: Family Medicine

## 2023-03-09 VITALS — BP 136/101 | HR 74 | Ht 63.0 in | Wt 339.5 lb

## 2023-03-09 DIAGNOSIS — E66813 Obesity, class 3: Secondary | ICD-10-CM

## 2023-03-09 DIAGNOSIS — I10 Essential (primary) hypertension: Secondary | ICD-10-CM

## 2023-03-09 DIAGNOSIS — Z6841 Body Mass Index (BMI) 40.0 and over, adult: Secondary | ICD-10-CM

## 2023-03-09 NOTE — Progress Notes (Signed)
Established patient visit   Patient: Carla Nichols   DOB: 05-27-1990   32 y.o. Female  MRN: [id number] Visit Date: 03/09/2023  Today's healthcare provider: Charlton Amor, DO   Chief Complaint  Patient presents with   Medical Management of Chronic Issues    Weight loss /medication f/u pt has not been able to get the [facility]    SUBJECTIVE    Chief Complaint  Patient presents with   Medical Management of Chronic Issues    Weight loss /medication f/u pt has not been able to get the wegovy   HPI HPI     Medical Management of Chronic Issues    Additional comments: Weight loss /medication f/u pt has not been able to get the wegovy      Last edited by Roselyn Reef, CMA on 03/09/2023  9:16 AM.      Pt presents for prediabetes and HTN follow up.  Prediabetes - 4 weeks ago A1c was 5.8  - diet and exercise - prescribed wegovy 0.25mg  but has been unable to find it.  - has lost significant weight already with diet control and exercise  HTN - on lisinopril 20mg   - pt forgot to take her meds this am - bp uncontrolled   Review of Systems  Constitutional:  Negative for activity change, fatigue and fever.  Respiratory:  Negative for cough and shortness of breath.   Cardiovascular:  Negative for chest pain.  Gastrointestinal:  Negative for abdominal pain.  Genitourinary:  Negative for difficulty urinating.       Current Meds  Medication Sig   albuterol (PROVENTIL) (2.5 MG/3ML) 0.083% nebulizer solution Take 3 mLs (2.5 mg total) by nebulization every 4 (four) hours as needed for wheezing or shortness of breath. (Patient taking differently: Take 2.5 mg by nebulization every 4 (four) hours as needed for wheezing or shortness of breath. PRN)   ipratropium-albuterol (DUONEB) 0.5-2.5 (3) MG/3ML SOLN Take 3 mLs by nebulization every 2 (two) hours as needed (wheezing and shortness of breath). (Patient taking differently: Take 3 mLs by nebulization every 2 (two) hours as  needed (wheezing and shortness of breath). PRN)   lisinopril (ZESTRIL) 20 MG tablet Take 1 tablet (20 mg total) by mouth daily.   metFORMIN (GLUCOPHAGE-XR) 500 MG 24 hr tablet Take 1 tablet (500 mg total) by mouth 2 (two) times daily with a meal.   WEGOVY 0.25 MG/0.5ML SOAJ Inject 0.25 mg into the skin once a week. Use this dose for 1 month (4 shots) and then increase to next higher dose. (Patient taking differently: Inject 0.25 mg into the skin once a week. Use this dose for 1 month (4 shots) and then increase to next higher dose.  Pt has not been able to find in stock)    OBJECTIVE    BP (!) 136/101 (BP Location: Left Arm, Patient Position: Sitting, Cuff Size: Large)   Pulse 74   Ht 5\' 3"  (1.6 m)   Wt (!) 339 lb 8 oz (154 kg)   SpO2 98%   BMI 60.14 kg/m   Physical Exam Vitals and nursing note reviewed.  Constitutional:      General: She is not in acute distress.    Appearance: She is obese.  HENT:     Head: Normocephalic and atraumatic.     Right Ear: External ear normal.     Left Ear: External ear normal.     Nose: Nose normal.  Eyes:     Conjunctiva/sclera:  Conjunctivae normal.  Cardiovascular:     Rate and Rhythm: Normal rate and regular rhythm.  Pulmonary:     Effort: Pulmonary effort is normal.     Breath sounds: Normal breath sounds.  Neurological:     General: No focal deficit present.     Mental Status: She is alert and oriented to person, place, and time.  Psychiatric:        Mood and Affect: Mood normal.        Behavior: Behavior normal.        Thought Content: Thought content normal.        Judgment: Judgment normal.        ASSESSMENT & PLAN    Problem List Items Addressed This Visit       Cardiovascular and Mediastinum   Primary hypertension - Primary    - uncontrolled but pt missed her medication  - follow up in two weeks for nurse visit        Other   Class 3 severe obesity due to excess calories with serious comorbidity and body mass index  (BMI) of 60.0 to 69.9 in adult [facility])    Pt unable to find wegovy in stock at pharmacy. She has called multiple pharmacies. Gave her a few other choices - congratulated her on her weight loss thus far.she has been doing so great cutting out sugars and sodas - 3 month follow up       Return in about 3 months (around 06/09/2023) for weight check and DM follow up.      No orders of the defined types were placed in this encounter.   No orders of the defined types were placed in this encounter.    Charlton Amor, DO  [facility] & Sports Medicine at [facility] [phone] (phone) [phone] (fax)  [facility]

## 2023-03-09 NOTE — Assessment & Plan Note (Signed)
-   uncontrolled but pt missed her medication  - follow up in two weeks for nurse visit

## 2023-03-09 NOTE — Assessment & Plan Note (Addendum)
Pt unable to find wegovy in stock at pharmacy. She has called multiple pharmacies. Gave her a few other choices - congratulated her on her weight loss thus far.she has been doing so great cutting out sugars and sodas - 3 month follow up

## 2023-03-10 ENCOUNTER — Other Ambulatory Visit: Payer: Self-pay

## 2023-03-23 ENCOUNTER — Ambulatory Visit: Payer: Medicaid Other

## 2023-03-25 ENCOUNTER — Ambulatory Visit: Payer: Medicaid Other

## 2023-03-26 ENCOUNTER — Ambulatory Visit (INDEPENDENT_AMBULATORY_CARE_PROVIDER_SITE_OTHER): Payer: Medicaid Other | Admitting: Family Medicine

## 2023-03-26 ENCOUNTER — Encounter: Payer: Self-pay | Admitting: Family Medicine

## 2023-03-26 ENCOUNTER — Ambulatory Visit: Payer: Medicaid Other

## 2023-03-26 VITALS — BP 146/104 | HR 87 | Ht 63.0 in | Wt 334.0 lb

## 2023-03-26 DIAGNOSIS — R062 Wheezing: Secondary | ICD-10-CM | POA: Insufficient documentation

## 2023-03-26 DIAGNOSIS — R051 Acute cough: Secondary | ICD-10-CM

## 2023-03-26 DIAGNOSIS — E66813 Obesity, class 3: Secondary | ICD-10-CM

## 2023-03-26 DIAGNOSIS — J4541 Moderate persistent asthma with (acute) exacerbation: Secondary | ICD-10-CM | POA: Diagnosis not present

## 2023-03-26 DIAGNOSIS — Z6841 Body Mass Index (BMI) 40.0 and over, adult: Secondary | ICD-10-CM

## 2023-03-26 DIAGNOSIS — K5903 Drug induced constipation: Secondary | ICD-10-CM | POA: Diagnosis not present

## 2023-03-26 MED ORDER — PREDNISONE 20 MG PO TABS: 40.0000 mg | ORAL_TABLET | Freq: Every day | ORAL | 0 refills | Status: AC

## 2023-03-26 MED ORDER — WEGOVY 0.5 MG/0.5ML ~~LOC~~ SOAJ
0.5000 mg | SUBCUTANEOUS | 0 refills | Status: DC
  Filled 2023-04-09: qty 2, 28d supply, fill #0

## 2023-03-26 MED ORDER — AIRSUPRA 90-80 MCG/ACT IN AERO: 2.0000 | INHALATION_SPRAY | Freq: Four times a day (QID) | RESPIRATORY_TRACT | 11 refills | Status: DC | PRN

## 2023-03-26 MED ORDER — ALBUTEROL SULFATE (2.5 MG/3ML) 0.083% IN NEBU: 2.5000 mg | INHALATION_SOLUTION | RESPIRATORY_TRACT | 0 refills | Status: DC | PRN

## 2023-03-26 NOTE — Assessment & Plan Note (Signed)
Pt has significant wheezing and coarse breath sounds on exam today. Will obtain cxr to rule out pna - differential includes asthma exacerbation vs pna - have sent in prednisone and Paulene Floor

## 2023-03-26 NOTE — Progress Notes (Signed)
Established patient visit   Patient: Carla Nichols   DOB: 11-02-1990   32 y.o. Female  MRN: [id number] Visit Date: 03/26/2023  Today's healthcare provider: Charlton Amor, DO   Chief Complaint  Patient presents with   Constipation    Asthma flair up    SUBJECTIVE    Chief Complaint  Patient presents with   Constipation    Asthma flair up   Constipation Pertinent negatives include no abdominal pain, difficulty urinating or fever.   HPI     Constipation    Additional comments: Asthma flair up      Last edited by Roselyn Reef, CMA on 03/26/2023 10:49 AM.      Pt presents with concerns of constipation secondary to taking wegovy. She is currently on wegovy 0.25mg . She was constipated for 10 days and finally had a bowel movement yesterday. She now has diarrhea.   She also notices her asthma has gotten worse. She admits to wheezing for the past few days and has had to use her son's inhaler. She did have nebulizer treatments at home and has had to use them as well. Still reports wheezing. Denies fevers/body aches.  Review of Systems  Constitutional:  Negative for activity change, fatigue and fever.  Respiratory:  Positive for cough and wheezing. Negative for shortness of breath.   Cardiovascular:  Negative for chest pain.  Gastrointestinal:  Positive for constipation. Negative for abdominal pain.  Genitourinary:  Negative for difficulty urinating.       Current Meds  Medication Sig   Albuterol-Budesonide (AIRSUPRA) 90-80 MCG/ACT AERO Inhale 2 puffs into the lungs every 6 (six) hours as needed.   ipratropium-albuterol (DUONEB) 0.5-2.5 (3) MG/3ML SOLN Take 3 mLs by nebulization every 2 (two) hours as needed (wheezing and shortness of breath). (Patient taking differently: Take 3 mLs by nebulization every 2 (two) hours as needed (wheezing and shortness of breath). PRN)   lisinopril (ZESTRIL) 20 MG tablet Take 1 tablet (20 mg total) by mouth daily.   metFORMIN  (GLUCOPHAGE-XR) 500 MG 24 hr tablet Take 1 tablet (500 mg total) by mouth 2 (two) times daily with a meal.   predniSONE (DELTASONE) 20 MG tablet Take 2 tablets (40 mg total) by mouth daily with breakfast for 5 days.   WEGOVY 0.25 MG/0.5ML SOAJ Inject 0.25 mg into the skin once a week. Use this dose for 1 month (4 shots) and then increase to next higher dose. (Patient taking differently: Inject 0.25 mg into the skin once a week. Use this dose for 1 month (4 shots) and then increase to next higher dose.)   WEGOVY 0.5 MG/0.5ML SOAJ Inject 0.5 mg into the skin once a week. Use this dose for 1 month (4 shots) and then increase to next higher dose.   [DISCONTINUED] albuterol (PROVENTIL) (2.5 MG/3ML) 0.083% nebulizer solution Take 3 mLs (2.5 mg total) by nebulization every 4 (four) hours as needed for wheezing or shortness of breath. (Patient taking differently: Take 2.5 mg by nebulization every 4 (four) hours as needed for wheezing or shortness of breath. PRN)    OBJECTIVE    BP (!) 146/104 (BP Location: Left Arm, Patient Position: Sitting, Cuff Size: Large)   Pulse 87   Ht 5\' 3"  (1.6 m)   Wt (!) 334 lb (151.5 kg)   SpO2 98%   BMI 59.17 kg/m   Physical Exam Vitals and nursing note reviewed.  Constitutional:      General: She is not in acute  distress.    Appearance: Normal appearance.  HENT:     Head: Normocephalic and atraumatic.     Right Ear: External ear normal.     Left Ear: External ear normal.     Nose: Nose normal.  Eyes:     Conjunctiva/sclera: Conjunctivae normal.  Cardiovascular:     Rate and Rhythm: Normal rate and regular rhythm.  Pulmonary:     Effort: Pulmonary effort is normal.     Breath sounds: Wheezing present.  Neurological:     General: No focal deficit present.     Mental Status: She is alert and oriented to person, place, and time.  Psychiatric:        Mood and Affect: Mood normal.        Behavior: Behavior normal.        Thought Content: Thought content  normal.        Judgment: Judgment normal.        ASSESSMENT & PLAN    Problem List Items Addressed This Visit       Respiratory   Exacerbation of asthma    Chest xray shows no signs of pneumonia, will go ahead and treat for asthma exacerbation  - will give prednisone 40mg  daily       Relevant Medications   albuterol (PROVENTIL) (2.5 MG/3ML) 0.083% nebulizer solution   Albuterol-Budesonide (AIRSUPRA) 90-80 MCG/ACT AERO   predniSONE (DELTASONE) 20 MG tablet     Digestive   Drug-induced constipation - Primary    Likely related to wegovy - recommended increasing hydration  - recommend increasing fiber with metamucil and using miralax - if persists we may need to consider alternate therapy        Other   Class 3 severe obesity due to excess calories with serious comorbidity and body mass index (BMI) of 60.0 to 69.9 in adult [facility])    Will go ahead and increase to next dose of wegovy 0.5mg       Relevant Medications   WEGOVY 0.5 MG/0.5ML SOAJ   Wheezing    Pt has significant wheezing and coarse breath sounds on exam today. Will obtain cxr to rule out pna - differential includes asthma exacerbation vs pna - have sent in prednisone and airsupra       Relevant Orders   DG Chest 2 View (Completed)   Other Visit Diagnoses     Acute cough       Relevant Orders   DG Chest 2 View (Completed)       No follow-ups on file.      Meds ordered this encounter  Medications   albuterol (PROVENTIL) (2.5 MG/3ML) 0.083% nebulizer solution    Sig: Take 3 mLs (2.5 mg total) by nebulization every 4 (four) hours as needed for wheezing or shortness of breath.    Dispense:  50 mL    Refill:  0   Albuterol-Budesonide (AIRSUPRA) 90-80 MCG/ACT AERO    Sig: Inhale 2 puffs into the lungs every 6 (six) hours as needed.    Dispense:  1 g    Refill:  11    Please dispense #1 inhaler   predniSONE (DELTASONE) 20 MG tablet    Sig: Take 2 tablets (40 mg total) by mouth daily with breakfast  for 5 days.    Dispense:  10 tablet    Refill:  0   WEGOVY 0.5 MG/0.5ML SOAJ    Sig: Inject 0.5 mg into the skin once a week. Use this dose for 1 month (  4 shots) and then increase to next higher dose.    Dispense:  2 mL    Refill:  0    Orders Placed This Encounter  Procedures   DG Chest 2 View    Standing Status:   Future    Number of Occurrences:   1    Standing Expiration Date:   03/25/2024    Order Specific Question:   Preferred imaging location?    Answer:   [facility]    Order Specific Question:   Reason for exam:    Answer:   wheezing    Order Specific Question:   Release to patient    Answer:   Immediate     Charlton Amor, DO  [facility] at [facility] [phone] (phone) [phone] (fax)  [facility]

## 2023-03-26 NOTE — Assessment & Plan Note (Signed)
Will go ahead and increase to next dose of wegovy 0.5mg 

## 2023-03-26 NOTE — Assessment & Plan Note (Addendum)
Likely related to wegovy - recommended increasing hydration  - recommend increasing fiber with metamucil and using miralax - if persists we may need to consider alternate therapy

## 2023-03-26 NOTE — Patient Instructions (Addendum)
Try daily metamucil   Try injecting in your thigh

## 2023-03-26 NOTE — Assessment & Plan Note (Signed)
Chest xray shows no signs of pneumonia, will go ahead and treat for asthma exacerbation  - will give prednisone 40mg  daily

## 2023-04-09 ENCOUNTER — Other Ambulatory Visit (HOSPITAL_BASED_OUTPATIENT_CLINIC_OR_DEPARTMENT_OTHER): Payer: Self-pay

## 2023-04-24 ENCOUNTER — Other Ambulatory Visit (HOSPITAL_BASED_OUTPATIENT_CLINIC_OR_DEPARTMENT_OTHER): Payer: Self-pay

## 2023-04-24 ENCOUNTER — Other Ambulatory Visit: Payer: Self-pay | Admitting: Family Medicine

## 2023-04-24 MED ORDER — WEGOVY 0.5 MG/0.5ML ~~LOC~~ SOAJ
0.5000 mg | SUBCUTANEOUS | 0 refills | Status: DC
  Filled 2023-04-24 – 2023-05-06 (×2): qty 2, 28d supply, fill #0

## 2023-05-06 ENCOUNTER — Other Ambulatory Visit (HOSPITAL_BASED_OUTPATIENT_CLINIC_OR_DEPARTMENT_OTHER): Payer: Self-pay

## 2023-05-09 ENCOUNTER — Other Ambulatory Visit: Payer: Self-pay | Admitting: Family Medicine

## 2023-05-09 DIAGNOSIS — I10 Essential (primary) hypertension: Secondary | ICD-10-CM

## 2023-06-01 ENCOUNTER — Other Ambulatory Visit: Payer: Self-pay | Admitting: Family Medicine

## 2023-06-01 ENCOUNTER — Other Ambulatory Visit (HOSPITAL_BASED_OUTPATIENT_CLINIC_OR_DEPARTMENT_OTHER): Payer: Self-pay

## 2023-06-01 MED ORDER — WEGOVY 0.5 MG/0.5ML ~~LOC~~ SOAJ
0.5000 mg | SUBCUTANEOUS | 0 refills | Status: DC
  Filled 2023-06-01: qty 2, 28d supply, fill #0

## 2023-06-12 ENCOUNTER — Ambulatory Visit: Payer: Medicaid Other | Admitting: Family Medicine

## 2023-06-23 ENCOUNTER — Other Ambulatory Visit: Payer: Self-pay | Admitting: Family Medicine

## 2023-07-05 ENCOUNTER — Other Ambulatory Visit: Payer: Self-pay | Admitting: Family Medicine

## 2023-07-06 ENCOUNTER — Other Ambulatory Visit (HOSPITAL_BASED_OUTPATIENT_CLINIC_OR_DEPARTMENT_OTHER): Payer: Self-pay

## 2023-07-06 MED ORDER — WEGOVY 0.5 MG/0.5ML ~~LOC~~ SOAJ
0.5000 mg | SUBCUTANEOUS | 0 refills | Status: DC
  Filled 2023-07-06: qty 2, 28d supply, fill #0

## 2023-07-20 ENCOUNTER — Ambulatory Visit: Payer: Self-pay

## 2023-07-20 NOTE — Telephone Encounter (Signed)
 Chief Complaint: Back pain and abdominal pain Symptoms: Back pain, abdominal pain, weakness in legs, vomiting Frequency: worsened since Thursday Pertinent Negatives: Patient denies n/a Disposition: [x] ED /[] Urgent Care (no appt availability in office) / [] Appointment(In office/virtual)/ []  [facility]/ [] Home Care/ [] Refused Recommended Disposition /[]  Mobile Bus/ []  Follow-up with PCP Additional Notes: Patient called stating she has been experiencing 10/10 back pain that she states is feeling like it is in her lower spinal area, but radiating down her buttocks and into her thighs. She is feeling weakness in her thighs and legs when she tries to stand and is having to use items nearby for support. Patient states she woke up in tears this morning out of discomfort. Patient states the pain is worsened when she tries to pass gas, sneeze, or cough. Patient states she is unable to pass gas without a very heavy strain, and has been experiencing severe constipation (has gone up to 10 days without a bowel movement) from the [facility] medication. Patient states she has also vomited an excessive amount of times recently due to the medication, stating that when she tries to lie down it causes severe stomach pain to the point where she has to vomit each time. Patient states she is continuing to drink water and her urine appears normal at this time. Patient advised to go to ED to rule out obstruction, pancreatitis, and further complications based off symptoms of severe abdominal pain and back pain with vomiting. Patient verbalized understanding and will be going to ED. Advised to follow up with PCP afterwards to schedule appt to discuss medication side effects.    Copied from CRM [id number]. Topic: Clinical - Red Word Triage >> Jul 20, 2023  7:55 AM Izetta Dakin wrote: [facility] that prompted transfer to Nurse Triage:  severe back and stomach pain Reason for Disposition  [1] SEVERE abdominal pain AND  [2] present > 1 hour  Answer Assessment - Initial Assessment Questions 1. ONSET: "When did the pain begin?"      Thursday 2. LOCATION: "Where does it hurt?" (upper, mid or lower back)     Lower back in center/spine 3. SEVERITY: "How bad is the pain?"  (e.g., Scale 1-10; mild, moderate, or severe)   - MILD (1-3): Doesn't interfere with normal activities.    - MODERATE (4-7): Interferes with normal activities or awakens from sleep.    - SEVERE (8-10): Excruciating pain, unable to do any normal activities.      10 4. PATTERN: "Is the pain constant?" (e.g., yes, no; constant, intermittent)      Constant 5. RADIATION: "Does the pain shoot into your legs or somewhere else?"     Down into buttocks and down into thigh 6. CAUSE:  "What do you think is causing the back pain?"      No 7. BACK OVERUSE:  "Any recent lifting of heavy objects, strenuous work or exercise?"     No 8. MEDICINES: "What have you taken so far for the pain?" (e.g., nothing, acetaminophen, NSAIDS)     Ibuprofen - doesn't help 9. NEUROLOGIC SYMPTOMS: "Do you have any weakness, numbness, or problems with bowel/bladder control?"     Numbness in hips, weakness 10. OTHER SYMPTOMS: "Do you have any other symptoms?" (e.g., fever, abdomen pain, burning with urination, blood in urine)       Abdominal pain 11. PREGNANCY: "Is there any chance you are pregnant?" "When was your last menstrual period?"       Patient is unsure  Protocols used: Back Pain-A-AH

## 2023-08-02 ENCOUNTER — Other Ambulatory Visit: Payer: Self-pay | Admitting: Family Medicine

## 2023-08-03 ENCOUNTER — Other Ambulatory Visit (HOSPITAL_BASED_OUTPATIENT_CLINIC_OR_DEPARTMENT_OTHER): Payer: Self-pay

## 2023-08-03 MED ORDER — WEGOVY 0.5 MG/0.5ML ~~LOC~~ SOAJ
0.5000 mg | SUBCUTANEOUS | 0 refills | Status: DC
  Filled 2023-08-03: qty 2, 28d supply, fill #0

## 2023-08-17 ENCOUNTER — Other Ambulatory Visit: Payer: Self-pay | Admitting: Family Medicine

## 2023-08-17 ENCOUNTER — Encounter: Admitting: Obstetrics and Gynecology

## 2023-08-18 ENCOUNTER — Other Ambulatory Visit: Payer: Self-pay

## 2023-08-18 MED ORDER — ALBUTEROL SULFATE (2.5 MG/3ML) 0.083% IN NEBU: 2.5000 mg | INHALATION_SOLUTION | RESPIRATORY_TRACT | 0 refills | Status: DC | PRN

## 2023-08-20 ENCOUNTER — Emergency Department (HOSPITAL_BASED_OUTPATIENT_CLINIC_OR_DEPARTMENT_OTHER)

## 2023-08-20 ENCOUNTER — Encounter (HOSPITAL_BASED_OUTPATIENT_CLINIC_OR_DEPARTMENT_OTHER): Payer: Self-pay | Admitting: Emergency Medicine

## 2023-08-20 ENCOUNTER — Emergency Department (HOSPITAL_BASED_OUTPATIENT_CLINIC_OR_DEPARTMENT_OTHER)
Admission: EM | Admit: 2023-08-20 | Discharge: 2023-08-20 | Disposition: A | Discharge status: Home / Self Care | Attending: Emergency Medicine | Admitting: Emergency Medicine

## 2023-08-20 ENCOUNTER — Other Ambulatory Visit: Payer: Self-pay

## 2023-08-20 DIAGNOSIS — Z7984 Long term (current) use of oral hypoglycemic drugs: Secondary | ICD-10-CM | POA: Insufficient documentation

## 2023-08-20 DIAGNOSIS — R0602 Shortness of breath: Secondary | ICD-10-CM | POA: Diagnosis present

## 2023-08-20 DIAGNOSIS — E119 Type 2 diabetes mellitus without complications: Secondary | ICD-10-CM | POA: Insufficient documentation

## 2023-08-20 DIAGNOSIS — J45909 Unspecified asthma, uncomplicated: Secondary | ICD-10-CM | POA: Insufficient documentation

## 2023-08-20 DIAGNOSIS — J069 Acute upper respiratory infection, unspecified: Secondary | ICD-10-CM | POA: Insufficient documentation

## 2023-08-20 DIAGNOSIS — R062 Wheezing: Secondary | ICD-10-CM

## 2023-08-20 DIAGNOSIS — Z79899 Other long term (current) drug therapy: Secondary | ICD-10-CM | POA: Diagnosis not present

## 2023-08-20 DIAGNOSIS — I10 Essential (primary) hypertension: Secondary | ICD-10-CM | POA: Insufficient documentation

## 2023-08-20 LAB — RESP PANEL BY RT-PCR (RSV, FLU A&B, COVID)  RVPGX2
Influenza A by PCR: NEGATIVE
Influenza B by PCR: NEGATIVE
Resp Syncytial Virus by PCR: NEGATIVE
SARS Coronavirus 2 by RT PCR: NEGATIVE

## 2023-08-20 MED ORDER — CETIRIZINE HCL 10 MG PO TABS: 10.0000 mg | ORAL_TABLET | Freq: Every day | ORAL | 0 refills | Status: DC

## 2023-08-20 MED ORDER — FAMOTIDINE 20 MG PO TABS
20.0000 mg | ORAL_TABLET | Freq: Once | ORAL | Status: AC
  Administered 2023-08-20: 20 mg via ORAL
  Filled 2023-08-20: qty 1

## 2023-08-20 MED ORDER — PREDNISONE 10 MG PO TABS: 20.0000 mg | ORAL_TABLET | Freq: Every day | ORAL | 0 refills | Status: DC

## 2023-08-20 MED ORDER — OMEPRAZOLE 20 MG PO CPDR: 20.0000 mg | DELAYED_RELEASE_CAPSULE | Freq: Every day | ORAL | 0 refills | Status: AC

## 2023-08-20 MED ORDER — IPRATROPIUM-ALBUTEROL 0.5-2.5 (3) MG/3ML IN SOLN
RESPIRATORY_TRACT | Status: AC
  Administered 2023-08-20: 3 mL via RESPIRATORY_TRACT
  Filled 2023-08-20: qty 3

## 2023-08-20 MED ORDER — IPRATROPIUM-ALBUTEROL 0.5-2.5 (3) MG/3ML IN SOLN
3.0000 mL | Freq: Once | RESPIRATORY_TRACT | Status: AC
Start: 2023-08-20 — End: 2023-08-20

## 2023-08-20 MED ORDER — PREDNISONE 50 MG PO TABS
60.0000 mg | ORAL_TABLET | Freq: Once | ORAL | Status: AC
  Administered 2023-08-20: 60 mg via ORAL
  Filled 2023-08-20: qty 1

## 2023-08-20 MED ORDER — ALUM & MAG HYDROXIDE-SIMETH 200-200-20 MG/5ML PO SUSP
30.0000 mL | Freq: Once | ORAL | Status: AC
  Administered 2023-08-20: 30 mL via ORAL
  Filled 2023-08-20: qty 30

## 2023-08-20 MED ORDER — LORATADINE 10 MG PO TABS
10.0000 mg | ORAL_TABLET | Freq: Once | ORAL | Status: AC
  Administered 2023-08-20: 10 mg via ORAL
  Filled 2023-08-20: qty 1

## 2023-08-20 NOTE — ED Triage Notes (Signed)
 Pt presents with shob since yesterday  Hx of asthma.  Has been doing neb treatments at home which only give her relief for about an hour. No fever or chills.

## 2023-08-20 NOTE — ED Provider Notes (Signed)
 [place] [facility] Provider Note   CSN: [id number] Arrival date & time: 08/20/23  5621     History  Chief Complaint  Patient presents with   Shortness of Breath    Carla Nichols is a 33 y.o. female.  The history is provided by the patient.  Shortness of Breath Severity:  Moderate Onset quality:  Gradual Duration:  1 day Progression:  Waxing and waning Chronicity:  Recurrent Context: URI   Context comment:  Took son to doctor for URI and has had congestion and wheezing since then Relieved by:  Nothing Worsened by:  Nothing Ineffective treatments:  Inhaler Associated symptoms: wheezing   Associated symptoms: no fever   Associated symptoms comment:  Congestion  Risk factors: no family hx of DVT, no hx of PE/DVT and no recent surgery   Patient states she took sone to doctor for URI yesterday and got sick herself with congestion and wheezing but inhaler is not making her feel better.  No CP,  No DOE, no travel, no leg pain, no OCP.      Past Medical History:  Diagnosis Date   Allergy    Anxiety    Arthritis    Asthma    pulmicort and albuterol  inhaler as needed   Diabetes mellitus without complication (HCC)    type 2 diet controlled   Dyspnea    Hypertension    no meds   Seizures (HCC)    as a child     Home Medications Prior to Admission medications   Medication Sig Start Date End Date Taking? Authorizing Provider  cetirizine  (ZYRTEC  ALLERGY) 10 MG tablet Take 1 tablet (10 mg total) by mouth daily. 08/20/23  Yes Tyneka Scafidi, MD  omeprazole  (PRILOSEC) 20 MG capsule Take 1 capsule (20 mg total) by mouth daily. 08/20/23  Yes Uri Turnbough, MD  predniSONE  (DELTASONE ) 10 MG tablet Take 2 tablets (20 mg total) by mouth daily. 08/20/23  Yes Sahirah Rudell, MD  albuterol  (PROVENTIL ) (2.5 MG/3ML) 0.083% nebulizer solution Take 3 mLs (2.5 mg total) by nebulization every 4 (four) hours as needed for wheezing or shortness of breath.  08/18/23   Cydney Draft, MD  Albuterol -Budesonide (AIRSUPRA ) 90-80 MCG/ACT AERO Inhale 2 puffs into the lungs every 6 (six) hours as needed. 03/26/23   Josepha Nickels, DO  ipratropium-albuterol  (DUONEB) 0.5-2.5 (3) MG/3ML SOLN Take 3 mLs by nebulization every 2 (two) hours as needed (wheezing and shortness of breath). Patient taking differently: Take 3 mLs by nebulization every 2 (two) hours as needed (wheezing and shortness of breath). PRN 05/01/22   Stephany Ehrich, MD  lisinopril  (ZESTRIL ) 20 MG tablet TAKE 1 TABLET(20 MG) BY MOUTH DAILY 05/11/23   Wachs, Erika S, DO  metFORMIN  (GLUCOPHAGE -XR) 500 MG 24 hr tablet TAKE 1 TABLET(500 MG) BY MOUTH TWICE DAILY WITH A MEAL 06/23/23   Breeback, Jade L, PA-C  WEGOVY  0.25 MG/0.5ML SOAJ Inject 0.25 mg into the skin once a week. Use this dose for 1 month (4 shots) and then increase to next higher dose. Patient taking differently: Inject 0.25 mg into the skin once a week. Use this dose for 1 month (4 shots) and then increase to next higher dose. 03/03/23   Josepha Nickels, DO  WEGOVY  0.5 MG/0.5ML SOAJ Inject 0.5 mg into the skin once a week. 08/03/23   Josepha Nickels, DO      Allergies    Bee venom and Shellfish allergy  Review of Systems   Review of Systems  Constitutional:  Negative for fever.  HENT:  Positive for congestion.   Respiratory:  Positive for shortness of breath and wheezing.   All other systems reviewed and are negative.   Physical Exam Updated Vital Signs BP (!) 138/96 (BP Location: Right Arm)   Pulse 89   Temp 98.9 F (37.2 C) (Oral)   Resp (!) 24   SpO2 99%  Physical Exam Vitals and nursing note reviewed.  Constitutional:      General: She is not in acute distress.    Appearance: Normal appearance. She is well-developed.  HENT:     Head: Normocephalic and atraumatic.     Nose: Nose normal.  Eyes:     Pupils: Pupils are equal, round, and reactive to light.  Cardiovascular:     Rate and Rhythm: Normal rate and  regular rhythm.     Pulses: Normal pulses.     Heart sounds: Normal heart sounds.  Pulmonary:     Effort: Pulmonary effort is normal. No respiratory distress.     Breath sounds: Normal breath sounds. No wheezing or rales.  Abdominal:     General: Bowel sounds are normal. There is no distension.     Palpations: Abdomen is soft.     Tenderness: There is no abdominal tenderness. There is no guarding or rebound.  Musculoskeletal:        General: Normal range of motion.     Cervical back: Neck supple.  Skin:    General: Skin is warm and dry.     Capillary Refill: Capillary refill takes less than 2 seconds.     Findings: No erythema or rash.  Neurological:     General: No focal deficit present.     Mental Status: She is alert and oriented to person, place, and time.     Deep Tendon Reflexes: Reflexes normal.  Psychiatric:        Mood and Affect: Mood normal.        Behavior: Behavior normal.     ED Results / Procedures / Treatments   Labs (all labs ordered are listed, but only abnormal results are displayed) Labs Reviewed  RESP PANEL BY RT-PCR (RSV, FLU A&B, COVID)  [id number]    EKG None  Radiology DG Chest Portable 1 View Result Date: 08/20/2023 EXAM: 1 VIEW(S) XRAY OF THE CHEST 08/20/2023 02:59:00 AM COMPARISON: 03/26/2023 CLINICAL HISTORY: Shortness of breath/asthma. Patient presents with shortness of breath since yesterday. History of asthma. Has been doing nebulizer treatments at home which only give her relief for about an hour. Patient shielded. Last menstrual period today. FINDINGS: LUNGS AND PLEURA: No consolidation. No pulmonary edema. No pleural effusion. No pneumothorax. HEART AND MEDIASTINUM: No acute abnormality of the cardiac and mediastinal silhouettes. BONES AND SOFT TISSUES: No acute osseous abnormality. IMPRESSION: 1. No acute process. Electronically signed by: Zadie Herter MD 08/20/2023 03:08 AM EDT RP Workstation: [id number]    Procedures Procedures     Medications Ordered in ED Medications  ipratropium-albuterol  (DUONEB) 0.5-2.5 (3) MG/3ML nebulizer solution 3 mL (3 mLs Nebulization Given 08/20/23 0255)  alum & mag hydroxide-simeth (MAALOX/MYLANTA) 200-200-20 MG/5ML suspension 30 mL (30 mLs Oral Given 08/20/23 0323)  famotidine  (PEPCID ) tablet 20 mg (20 mg Oral Given 08/20/23 0323)  loratadine  (CLARITIN ) tablet 10 mg (10 mg Oral Given 08/20/23 0323)  predniSONE  (DELTASONE ) tablet 60 mg (60 mg Oral Given 08/20/23 0422)    ED Course/ Medical Decision Making/ A&P  Medical Decision Making Wheezing and congestion   Amount and/or Complexity of Data Reviewed External Data Reviewed: notes.    Details: Previous notes reviewed  Labs: ordered.    Details: Negative covid and flu  Radiology: ordered and independent interpretation performed.    Details: Normal CXR  Risk OTC drugs. Prescription drug management. Risk Details: Was reportedly wheezing in triage and given a neb treatment.  No further wheezing here.  Will start steroids and zyrtec .  I will also start PPI as acid reflux can cause these symptoms.  PERC engative wells 0, highly doubt PE in this patient.  Stable for discharge.  Strict returns.      Final Clinical Impression(s) / ED Diagnoses Final diagnoses:  Wheeze  Upper respiratory tract infection, unspecified type  Return for intractable cough, coughing up blood, fevers > 100.4 unrelieved by medication, shortness of breath, intractable vomiting, chest pain, shortness of breath, weakness, numbness, changes in speech, facial asymmetry, abdominal pain, passing out, Inability to tolerate liquids or food, cough, altered mental status or any concerns. No signs of systemic illness or infection. The patient is nontoxic-appearing on exam and vital signs are within normal limits.  I have reviewed the triage vital signs and the nursing notes. Pertinent labs & imaging results that were available during my care of the  patient were reviewed by me and considered in my medical decision making (see chart for details). After history, exam, and medical workup I feel the patient has been appropriately medically screened and is safe for discharge home. Pertinent diagnoses were discussed with the patient. Patient was given return precautions.       Rx / DC Orders ED Discharge Orders          Ordered    predniSONE  (DELTASONE ) 10 MG tablet  Daily        08/20/23 0405    cetirizine  (ZYRTEC  ALLERGY) 10 MG tablet  Daily        08/20/23 0405    omeprazole  (PRILOSEC) 20 MG capsule  Daily        08/20/23 0405              Nykira Reddix, MD 08/20/23 4098

## 2023-08-27 ENCOUNTER — Encounter: Payer: Self-pay | Admitting: Family Medicine

## 2023-09-01 ENCOUNTER — Other Ambulatory Visit: Payer: Self-pay | Admitting: Family Medicine

## 2023-09-02 ENCOUNTER — Other Ambulatory Visit (HOSPITAL_BASED_OUTPATIENT_CLINIC_OR_DEPARTMENT_OTHER): Payer: Self-pay

## 2023-09-02 MED ORDER — WEGOVY 0.5 MG/0.5ML ~~LOC~~ SOAJ
0.5000 mg | SUBCUTANEOUS | 0 refills | Status: DC
Start: 2023-09-02 — End: 2023-09-09
  Filled 2023-09-02: qty 2, 28d supply, fill #0

## 2023-09-03 ENCOUNTER — Telehealth: Payer: Self-pay

## 2023-09-03 ENCOUNTER — Other Ambulatory Visit (HOSPITAL_COMMUNITY): Payer: Self-pay

## 2023-09-03 ENCOUNTER — Other Ambulatory Visit (HOSPITAL_BASED_OUTPATIENT_CLINIC_OR_DEPARTMENT_OTHER): Payer: Self-pay

## 2023-09-03 NOTE — Telephone Encounter (Signed)
 Pharmacy Patient Advocate Encounter   Received notification from CoverMyMeds that prior authorization for Wegovy  0.5MG /0.5ML auto-injectors is required/requested.   Insurance verification completed.   The patient is insured through [facility] .   Per test claim: PA required; However, NEW/RECENT labs/notes are needed to complete & submit PA request. Please see below.     KEY: [id number]

## 2023-09-04 ENCOUNTER — Other Ambulatory Visit (HOSPITAL_BASED_OUTPATIENT_CLINIC_OR_DEPARTMENT_OTHER): Payer: Self-pay

## 2023-09-07 ENCOUNTER — Ambulatory Visit: Admitting: Family Medicine

## 2023-09-07 ENCOUNTER — Other Ambulatory Visit (HOSPITAL_BASED_OUTPATIENT_CLINIC_OR_DEPARTMENT_OTHER): Payer: Self-pay

## 2023-09-08 ENCOUNTER — Other Ambulatory Visit (HOSPITAL_BASED_OUTPATIENT_CLINIC_OR_DEPARTMENT_OTHER): Payer: Self-pay

## 2023-09-09 ENCOUNTER — Other Ambulatory Visit (HOSPITAL_BASED_OUTPATIENT_CLINIC_OR_DEPARTMENT_OTHER): Payer: Self-pay

## 2023-09-09 ENCOUNTER — Encounter: Payer: Self-pay | Admitting: Family Medicine

## 2023-09-09 ENCOUNTER — Ambulatory Visit: Admitting: Family Medicine

## 2023-09-09 VITALS — BP 136/108 | HR 79 | Temp 98.6°F | Resp 18 | Ht 63.0 in | Wt 307.9 lb

## 2023-09-09 DIAGNOSIS — R7303 Prediabetes: Secondary | ICD-10-CM

## 2023-09-09 DIAGNOSIS — R112 Nausea with vomiting, unspecified: Secondary | ICD-10-CM

## 2023-09-09 DIAGNOSIS — T887XXA Unspecified adverse effect of drug or medicament, initial encounter: Secondary | ICD-10-CM

## 2023-09-09 DIAGNOSIS — K209 Esophagitis, unspecified without bleeding: Secondary | ICD-10-CM

## 2023-09-09 DIAGNOSIS — J4540 Moderate persistent asthma, uncomplicated: Secondary | ICD-10-CM | POA: Diagnosis not present

## 2023-09-09 NOTE — Progress Notes (Signed)
 Established Patient Office Visit  Subjective   Patient ID: Carla Nichols, female    DOB: 02-14-1991  Age: 33 y.o. MRN: [id number]  Chief Complaint  Patient presents with   Hospitalization Follow-up    Patient was seen at the ER due to shortness of breath.She states that she was told that it was a side effect from the weight loss shot   Weight Check    HPI  Pt previous pt of Dr Dianah Fort, transferring care to me. Pt is new to me today but established with [facility].  Asthma Pt with hx of asthma. She felt chest tightness with DOE on May 1. She did her nebulizer every hour but unsure how many times she did it before going to ER. She had CXR and was negative. She was given Prednisone , Omeprazole  for suspected GERD, and Claritin .   She reports she has been on Wegovy  since Dec 2024 for prediabetes and is on 0.5mg  weekly. Since starting the injection, she had GERD with vomiting. She reports some days she would vomit multiple times a day. Last shot was May 5th and she vomited that day.    Review of Systems  Respiratory:  Positive for shortness of breath.   Gastrointestinal:  Positive for vomiting.  All other systems reviewed and are negative.    Objective:     BP (!) 136/108   Pulse 79   Temp 98.6 F (37 C) (Oral)   Resp 18   Ht 5\' 3"  (1.6 m)   Wt (!) 307 lb 14.4 oz (139.7 kg)   SpO2 98%   BMI 54.54 kg/m  BP Readings from Last 3 Encounters:  09/09/23 (!) 136/108  08/20/23 (!) 128/90  03/26/23 (!) 146/104      Physical Exam Vitals and nursing note reviewed.  Constitutional:      Appearance: Normal appearance. She is obese.  HENT:     Head: Normocephalic and atraumatic.     Right Ear: External ear normal.     Left Ear: External ear normal.     Nose: Nose normal.     Mouth/Throat:     Mouth: Mucous membranes are moist.     Pharynx: Oropharynx is clear.  Eyes:     Conjunctiva/sclera: Conjunctivae normal.     Pupils: Pupils are equal, round, and reactive to light.   Cardiovascular:     Rate and Rhythm: Normal rate and regular rhythm.     Pulses: Normal pulses.     Heart sounds: Normal heart sounds.  Pulmonary:     Effort: Pulmonary effort is normal.     Breath sounds: Normal breath sounds.  Skin:    General: Skin is warm.     Capillary Refill: Capillary refill takes less than 2 seconds.  Neurological:     General: No focal deficit present.     Mental Status: She is alert and oriented to person, place, and time. Mental status is at baseline.  Psychiatric:        Mood and Affect: Mood normal.        Behavior: Behavior normal.        Thought Content: Thought content normal.        Judgment: Judgment normal.    No results found for any visits on 09/09/23.     The ASCVD Risk score (Arnett DK, et al., 2019) failed to calculate for the following reasons:   The 2019 ASCVD risk score is only valid for ages 84 to 73  Assessment & Plan:   Problem List Items Addressed This Visit   None Prediabetes -     Hemoglobin A1c  Esophagitis  Nausea and vomiting, unspecified vomiting type -     Lipase -     Amylase -     Comprehensive metabolic panel with GFR  Side effect of medication  Moderate persistent asthma without complication   Pt with hx of prediabetes and was on Wegovy . She isn't tolerating this well. To stop this and all GLP-1 injections.  Vomiting from medication likely caused esophagitis leading to SOB. Her asthma is well controlled at this time. To check amylase, lipase, and CMP along with advised to continued Omeprazole .    No follow-ups on file.    Manette Section, MD

## 2023-09-09 NOTE — Telephone Encounter (Signed)
PA expired

## 2023-09-10 ENCOUNTER — Ambulatory Visit: Payer: Self-pay | Admitting: Family Medicine

## 2023-09-10 LAB — COMPREHENSIVE METABOLIC PANEL WITH GFR
ALT: 16 IU/L (ref 0–32)
AST: 17 IU/L (ref 0–40)
Albumin: 3.9 g/dL — ABNORMAL LOW (ref 4.0–5.0)
Alkaline Phosphatase: 55 IU/L (ref 44–121)
BUN/Creatinine Ratio: 13 (ref 9–23)
BUN: 8 mg/dL (ref 6–20)
Bilirubin Total: 0.4 mg/dL (ref 0.0–1.2)
CO2: 19 mmol/L — ABNORMAL LOW (ref 20–29)
Calcium: 9.5 mg/dL (ref 8.7–10.2)
Chloride: 103 mmol/L (ref 96–106)
Creatinine, Ser: 0.61 mg/dL (ref 0.57–1.00)
Globulin, Total: 2.9 g/dL (ref 1.5–4.5)
Glucose: 84 mg/dL (ref 70–99)
Potassium: 4.3 mmol/L (ref 3.5–5.2)
Sodium: 138 mmol/L (ref 134–144)
Total Protein: 6.8 g/dL (ref 6.0–8.5)
eGFR: 123 mL/min/{1.73_m2} (ref >59)

## 2023-09-10 LAB — AMYLASE: Amylase: 38 U/L (ref 31–110)

## 2023-09-10 LAB — HEMOGLOBIN A1C
Est. average glucose Bld gHb Est-mCnc: 105 mg/dL
Hgb A1c MFr Bld: 5.3 % (ref 4.8–5.6)

## 2023-09-10 LAB — LIPASE: Lipase: 19 U/L (ref 14–72)

## 2023-09-17 ENCOUNTER — Ambulatory Visit: Payer: Medicaid Other | Admitting: Family Medicine

## 2023-09-28 ENCOUNTER — Ambulatory Visit: Admitting: Family Medicine

## 2023-09-30 ENCOUNTER — Ambulatory Visit: Admitting: Family Medicine

## 2023-12-15 ENCOUNTER — Ambulatory Visit: Payer: Self-pay

## 2023-12-15 NOTE — Telephone Encounter (Signed)
 Patient going to Urgent Care today  FYI Only or Action Required?: FYI only for provider.  Patient was last seen in primary care on 09/09/2023 by Colette Torrence GRADE, MD.  Called Nurse Triage reporting Headache.  Symptoms began 2 days ago.  Interventions attempted: OTC medications: Excedrin and Rest, hydration, or home remedies.  Symptoms are: gradually worsening.  Triage Disposition: See HCP Within 4 Hours (Or PCP Triage)  Patient/caregiver understands and will follow disposition?: Yes--Patient going to Urgent Care           Copied from CRM #[id number]. Topic: Clinical - Red Word Triage >> Dec 15, 2023  2:43 PM Cherylann RAMAN wrote: Red Word that prompted transfer to Nurse Triage: Patient called in with moderate headaches. She has taken Excedrin for them with no relief. Patient states this has been going on now for 2 days. In addition, patient feels tired with no energy and a dry mouth. Patient has hypertension and has been taking her BP medication Reason for Disposition  [1] SEVERE headache (e.g., excruciating) AND [2] not improved after 2 hours of pain medicine  Answer Assessment - Initial Assessment Questions Just left Uncle's funeral Headache for the past two days Diarrhea two days ago before headache started History of high blood pressure Excedrine didn't help Nausea feeling  Patient stopped Metformin  due to her A1c getting better at last visit and she did not tolerate the WeGovy  injection well she states.  With patient's medical history, recent traumatic life events with an uncle passing away, she is advised that the recommendation from this Triage RN is to be seen and evaluated today, especially to check her blood pressure to ensure this is not elevated Patient states she feels like her headache could be related to her blood pressure but she doesn't have a way to check it With no openings in patient's PCP office, patient agrees to go to an Urgent Care today for further  evaluation  Patient is advised that if anything worsens to go to the Emergency Room. Patient verbalized understanding.     1. LOCATION: Where does it hurt?      Pressure all over 2. ONSET: When did the headache start? (e.g., minutes, hours, days)      2 days ago 3. PATTERN: Does the pain come and go, or has it been constant since it started?     constant 4. SEVERITY: How bad is the pain? and What does it keep you from doing?  (e.g., Scale 1-10; mild, moderate, or severe)     5 5. RECURRENT SYMPTOM: Have you ever had headaches before? If Yes, ask: When was the last time? and What happened that time?      yes 6. CAUSE: What do you think is causing the headache?     Unsure 7. MIGRAINE: Have you been diagnosed with migraine headaches? If Yes, ask: Is this headache similar?      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 HEAD INJURY: Has there been any recent injury to your head?      No 9. OTHER SYMPTOMS: Do you have any other symptoms? (e.g., fever, stiff neck, eye pain, sore throat, cold symptoms)     Diarrhea 2 days ago and then headache started 10. PREGNANCY: Is there any chance you are pregnant? When was your last menstrual period?       Unsure ---  should be starting period this week  Protocols used: Headache-A-AH

## 2024-05-10 ENCOUNTER — Encounter: Payer: Self-pay | Admitting: Family Medicine

## 2024-05-10 ENCOUNTER — Ambulatory Visit: Admitting: Family Medicine

## 2024-05-10 VITALS — BP 128/76 | HR 60 | Ht 63.0 in | Wt 304.0 lb

## 2024-05-10 DIAGNOSIS — N912 Amenorrhea, unspecified: Secondary | ICD-10-CM | POA: Diagnosis not present

## 2024-05-10 NOTE — Progress Notes (Signed)
" ° °  Acute Office Visit  Subjective:     Patient ID: Carla Nichols, female    DOB: 1991/02/13, 34 y.o.   MRN: [id number]  Chief Complaint  Patient presents with   Possible Pregnancy    Last period believes first week of November - periods very irregular  Previously about for one year they were consistent. Waking up throwing up - milk coming out when she squeezes her breast? - sleeping a lot/ tired - painful intercourse  NEEDED WATER HAS NOT USED RESTROOM    Possible Pregnancy  Patient is in today for acute visit.  Pt has had irregular menses and no period since November 2025. She has had + pregnancy test at home. She is here for confirmation of pregnancy test. She's having  nausea/vomiting and milky discharge from breast. She took a few pregnancy tests and some came back with faint line.   Review of Systems  All other systems reviewed and are negative.       Objective:    BP 128/76 (BP Location: Left Wrist, Patient Position: Sitting, Cuff Size: Normal)   Pulse 60   Ht 5' 3 (1.6 m)   Wt (!) 304 lb (137.9 kg)   LMP 02/22/2024 (Approximate)   SpO2 99%   BMI 53.85 kg/m    Physical Exam Vitals and nursing note reviewed.  Constitutional:      Appearance: Normal appearance. She is obese.  HENT:     Head: Normocephalic and atraumatic.     Right Ear: External ear normal.     Left Ear: External ear normal.     Nose: Nose normal.     Mouth/Throat:     Mouth: Mucous membranes are moist.     Pharynx: Oropharynx is clear.  Eyes:     Conjunctiva/sclera: Conjunctivae normal.     Pupils: Pupils are equal, round, and reactive to light.  Cardiovascular:     Rate and Rhythm: Normal rate.  Pulmonary:     Effort: Pulmonary effort is normal.  Skin:    General: Skin is warm.     Capillary Refill: Capillary refill takes less than 2 seconds.  Neurological:     General: No focal deficit present.     Mental Status: She is alert and oriented to person, place, and time. Mental  status is at baseline.  Psychiatric:        Mood and Affect: Mood normal.        Behavior: Behavior normal.        Thought Content: Thought content normal.        Judgment: Judgment normal.    No results found for any visits on 05/10/24.      Assessment & Plan:   Problem List Items Addressed This Visit   None Visit Diagnoses       Amenorrhea    -  Primary   Relevant Orders   POCT urine pregnancy   Beta HCG, Quant     Amenorrhea -     Beta hCG quant (ref lab)   Pt with amenorrhea and positive urine pregnancy test at home. To do serum confirmatory test today. Follow up on results.   No orders of the defined types were placed in this encounter.   No follow-ups on file.  Torrence CINDERELLA Barrier, MD   "

## 2024-05-11 ENCOUNTER — Ambulatory Visit: Payer: Self-pay | Admitting: Family Medicine

## 2024-05-11 LAB — BETA HCG QUANT (REF LAB): hCG Quant: 25 m[IU]/mL

## 2024-06-01 ENCOUNTER — Encounter
